# Patient Record
Sex: Female | Born: 1975
Health system: Southern US, Community
[De-identification: ages and names within clinical notes are randomized; demographics above are authoritative.]

## PROBLEM LIST (undated history)

## (undated) DIAGNOSIS — F419 Anxiety disorder, unspecified: Secondary | ICD-10-CM

## (undated) DIAGNOSIS — K8020 Calculus of gallbladder without cholecystitis without obstruction: Secondary | ICD-10-CM

## (undated) DIAGNOSIS — G44019 Episodic cluster headache, not intractable: Secondary | ICD-10-CM

## (undated) DIAGNOSIS — R42 Dizziness and giddiness: Secondary | ICD-10-CM

## (undated) DIAGNOSIS — N200 Calculus of kidney: Secondary | ICD-10-CM

## (undated) DIAGNOSIS — Z87442 Personal history of urinary calculi: Secondary | ICD-10-CM

## (undated) DIAGNOSIS — N939 Abnormal uterine and vaginal bleeding, unspecified: Secondary | ICD-10-CM

## (undated) DIAGNOSIS — G44009 Cluster headache syndrome, unspecified, not intractable: Secondary | ICD-10-CM

## (undated) DIAGNOSIS — D259 Leiomyoma of uterus, unspecified: Secondary | ICD-10-CM

## (undated) DIAGNOSIS — K589 Irritable bowel syndrome without diarrhea: Secondary | ICD-10-CM

## (undated) DIAGNOSIS — R51 Headache: Secondary | ICD-10-CM

## (undated) DIAGNOSIS — Z973 Presence of spectacles and contact lenses: Secondary | ICD-10-CM

## (undated) DIAGNOSIS — D649 Anemia, unspecified: Secondary | ICD-10-CM

## (undated) HISTORY — DX: Calculus of kidney: N20.0

## (undated) HISTORY — DX: Cluster headache syndrome, unspecified, not intractable: G44.009

## (undated) HISTORY — PX: EYE SURGERY: SHX253

## (undated) HISTORY — PX: CHOLECYSTECTOMY: SHX55

## (undated) HISTORY — PX: STRABISMUS SURGERY: SHX218

## (undated) HISTORY — DX: Anemia, unspecified: D64.9

## (undated) HISTORY — DX: Dizziness and giddiness: R42

---

## 1997-07-28 ENCOUNTER — Other Ambulatory Visit: Admission: RE | Admit: 1997-07-28 | Discharge: 1997-07-28 | Payer: Self-pay | Admitting: Obstetrics and Gynecology

## 1997-12-12 ENCOUNTER — Other Ambulatory Visit: Admission: RE | Admit: 1997-12-12 | Discharge: 1997-12-12 | Payer: Self-pay | Admitting: Obstetrics and Gynecology

## 2000-02-04 ENCOUNTER — Other Ambulatory Visit: Admission: RE | Admit: 2000-02-04 | Discharge: 2000-02-04 | Payer: Self-pay | Admitting: Obstetrics and Gynecology

## 2001-04-26 ENCOUNTER — Other Ambulatory Visit: Admission: RE | Admit: 2001-04-26 | Discharge: 2001-04-26 | Payer: Self-pay | Admitting: Obstetrics and Gynecology

## 2002-04-29 ENCOUNTER — Other Ambulatory Visit: Admission: RE | Admit: 2002-04-29 | Discharge: 2002-04-29 | Payer: Self-pay | Admitting: Obstetrics and Gynecology

## 2002-07-26 ENCOUNTER — Ambulatory Visit (HOSPITAL_COMMUNITY): Admission: RE | Admit: 2002-07-26 | Discharge: 2002-07-26 | Payer: Self-pay | Admitting: Obstetrics and Gynecology

## 2002-07-26 ENCOUNTER — Encounter: Payer: Self-pay | Admitting: Obstetrics and Gynecology

## 2002-12-02 ENCOUNTER — Inpatient Hospital Stay (HOSPITAL_COMMUNITY): Admission: AD | Admit: 2002-12-02 | Discharge: 2002-12-05 | Payer: Self-pay | Admitting: Obstetrics and Gynecology

## 2002-12-06 ENCOUNTER — Encounter: Admission: RE | Admit: 2002-12-06 | Discharge: 2003-01-05 | Payer: Self-pay | Admitting: Obstetrics and Gynecology

## 2002-12-06 ENCOUNTER — Observation Stay (HOSPITAL_COMMUNITY): Admission: AD | Admit: 2002-12-06 | Discharge: 2002-12-07 | Payer: Self-pay | Admitting: Obstetrics and Gynecology

## 2003-02-05 ENCOUNTER — Encounter: Admission: RE | Admit: 2003-02-05 | Discharge: 2003-03-07 | Payer: Self-pay | Admitting: Obstetrics and Gynecology

## 2003-03-08 ENCOUNTER — Encounter: Admission: RE | Admit: 2003-03-08 | Discharge: 2003-04-07 | Payer: Self-pay | Admitting: Obstetrics and Gynecology

## 2003-05-06 ENCOUNTER — Encounter: Admission: RE | Admit: 2003-05-06 | Discharge: 2003-06-05 | Payer: Self-pay | Admitting: Obstetrics and Gynecology

## 2003-07-06 ENCOUNTER — Encounter: Admission: RE | Admit: 2003-07-06 | Discharge: 2003-08-05 | Payer: Self-pay | Admitting: Obstetrics and Gynecology

## 2003-09-05 ENCOUNTER — Encounter: Admission: RE | Admit: 2003-09-05 | Discharge: 2003-10-05 | Payer: Self-pay | Admitting: Obstetrics and Gynecology

## 2003-10-06 ENCOUNTER — Encounter: Admission: RE | Admit: 2003-10-06 | Discharge: 2003-11-05 | Payer: Self-pay | Admitting: Obstetrics and Gynecology

## 2003-11-30 ENCOUNTER — Other Ambulatory Visit: Admission: RE | Admit: 2003-11-30 | Discharge: 2003-11-30 | Payer: Self-pay | Admitting: Obstetrics and Gynecology

## 2003-12-06 ENCOUNTER — Encounter: Admission: RE | Admit: 2003-12-06 | Discharge: 2004-01-05 | Payer: Self-pay | Admitting: Obstetrics and Gynecology

## 2004-02-21 ENCOUNTER — Ambulatory Visit: Payer: Self-pay | Admitting: Internal Medicine

## 2004-05-09 ENCOUNTER — Ambulatory Visit: Payer: Self-pay | Admitting: Internal Medicine

## 2004-06-18 ENCOUNTER — Ambulatory Visit: Payer: Self-pay | Admitting: Internal Medicine

## 2004-07-03 ENCOUNTER — Ambulatory Visit: Payer: Self-pay | Admitting: Internal Medicine

## 2004-07-03 ENCOUNTER — Encounter (INDEPENDENT_AMBULATORY_CARE_PROVIDER_SITE_OTHER): Payer: Self-pay | Admitting: *Deleted

## 2004-07-03 HISTORY — PX: COLONOSCOPY WITH PROPOFOL: SHX5780

## 2004-07-17 ENCOUNTER — Ambulatory Visit: Payer: Self-pay | Admitting: Internal Medicine

## 2004-10-09 ENCOUNTER — Ambulatory Visit: Payer: Self-pay | Admitting: Internal Medicine

## 2004-11-13 ENCOUNTER — Ambulatory Visit: Payer: Self-pay | Admitting: Internal Medicine

## 2005-02-06 ENCOUNTER — Ambulatory Visit: Payer: Self-pay | Admitting: Internal Medicine

## 2005-07-02 ENCOUNTER — Other Ambulatory Visit: Admission: RE | Admit: 2005-07-02 | Discharge: 2005-07-02 | Payer: Self-pay | Admitting: Obstetrics and Gynecology

## 2007-12-24 ENCOUNTER — Telehealth: Payer: Self-pay | Admitting: Internal Medicine

## 2008-12-14 ENCOUNTER — Ambulatory Visit (HOSPITAL_COMMUNITY): Admission: RE | Admit: 2008-12-14 | Discharge: 2008-12-14 | Payer: Self-pay | Admitting: Obstetrics and Gynecology

## 2009-04-16 ENCOUNTER — Inpatient Hospital Stay (HOSPITAL_COMMUNITY): Admission: AD | Admit: 2009-04-16 | Discharge: 2009-04-17 | Payer: Self-pay | Admitting: Obstetrics and Gynecology

## 2009-05-05 ENCOUNTER — Inpatient Hospital Stay (HOSPITAL_COMMUNITY): Admission: AD | Admit: 2009-05-05 | Discharge: 2009-05-05 | Payer: Self-pay | Admitting: Obstetrics and Gynecology

## 2009-05-07 ENCOUNTER — Inpatient Hospital Stay (HOSPITAL_COMMUNITY): Admission: AD | Admit: 2009-05-07 | Discharge: 2009-05-10 | Payer: Self-pay | Admitting: Obstetrics and Gynecology

## 2010-02-17 ENCOUNTER — Encounter: Payer: Self-pay | Admitting: Family Medicine

## 2010-04-13 ENCOUNTER — Emergency Department (HOSPITAL_COMMUNITY)
Admission: EM | Admit: 2010-04-13 | Discharge: 2010-04-13 | Disposition: A | Discharge status: Home / Self Care | Payer: Self-pay | Attending: Emergency Medicine | Admitting: Emergency Medicine

## 2010-04-13 ENCOUNTER — Emergency Department (HOSPITAL_COMMUNITY): Payer: Self-pay

## 2010-04-13 DIAGNOSIS — R109 Unspecified abdominal pain: Secondary | ICD-10-CM | POA: Insufficient documentation

## 2010-04-13 DIAGNOSIS — N949 Unspecified condition associated with female genital organs and menstrual cycle: Secondary | ICD-10-CM | POA: Insufficient documentation

## 2010-04-13 DIAGNOSIS — N898 Other specified noninflammatory disorders of vagina: Secondary | ICD-10-CM | POA: Insufficient documentation

## 2010-04-13 DIAGNOSIS — R102 Pelvic and perineal pain: Secondary | ICD-10-CM

## 2010-04-13 LAB — WET PREP, GENITAL: Clue Cells Wet Prep HPF POC: NONE SEEN

## 2010-04-13 LAB — URINALYSIS, ROUTINE W REFLEX MICROSCOPIC
Bilirubin Urine: NEGATIVE
Nitrite: NEGATIVE
Specific Gravity, Urine: 1.024 (ref 1.005–1.030)
Urobilinogen, UA: 0.2 mg/dL (ref 0.0–1.0)
pH: 6 (ref 5.0–8.0)

## 2010-04-13 LAB — URINE MICROSCOPIC-ADD ON

## 2010-04-17 LAB — CBC
HCT: 26.6 % — ABNORMAL LOW (ref 36.0–46.0)
HCT: 32.4 % — ABNORMAL LOW (ref 36.0–46.0)
Hemoglobin: 8.8 g/dL — ABNORMAL LOW (ref 12.0–15.0)
Hemoglobin: 9 g/dL — ABNORMAL LOW (ref 12.0–15.0)
MCHC: 33.2 g/dL (ref 30.0–36.0)
MCV: 87.8 fL (ref 78.0–100.0)
Platelets: 283 10*3/uL (ref 150–400)
RBC: 3.02 MIL/uL — ABNORMAL LOW (ref 3.87–5.11)
RBC: 3.02 MIL/uL — ABNORMAL LOW (ref 3.87–5.11)
RBC: 3.76 MIL/uL — ABNORMAL LOW (ref 3.87–5.11)
WBC: 13.1 10*3/uL — ABNORMAL HIGH (ref 4.0–10.5)

## 2010-04-17 LAB — COMPREHENSIVE METABOLIC PANEL
BUN: 4 mg/dL — ABNORMAL LOW (ref 6–23)
CO2: 27 mEq/L (ref 19–32)
Calcium: 8.7 mg/dL (ref 8.4–10.5)
Creatinine, Ser: 0.38 mg/dL — ABNORMAL LOW (ref 0.4–1.2)
GFR calc non Af Amer: >60 mL/min (ref >60)
Glucose, Bld: 79 mg/dL (ref 70–99)

## 2010-04-17 LAB — GC/CHLAMYDIA PROBE AMP, GENITAL: Chlamydia, DNA Probe: NEGATIVE

## 2010-04-17 LAB — RPR: RPR Ser Ql: NONREACTIVE

## 2010-04-21 LAB — URINE MICROSCOPIC-ADD ON

## 2010-04-21 LAB — URINALYSIS, ROUTINE W REFLEX MICROSCOPIC
Ketones, ur: NEGATIVE mg/dL
Nitrite: NEGATIVE
Specific Gravity, Urine: 1.01 (ref 1.005–1.030)
pH: 7 (ref 5.0–8.0)

## 2010-04-21 LAB — URINE CULTURE: Culture: NO GROWTH

## 2010-06-14 NOTE — Consult Note (Signed)
NAME:  Janet Myers, Janet Myers                       ACCOUNT NO.:  [id number]   MEDICAL RECORD NO.:  [id number]                   PATIENT TYPE:  INP   LOCATION:  9374                                 FACILITY:  [facility]   PHYSICIAN:  Michael L. Thad Ranger, M.D.           DATE OF BIRTH:  Apr 23, 1975   DATE OF CONSULTATION:  12/07/2002  DATE OF DISCHARGE:                                   CONSULTATION   CONSULTING PHYSICIAN:  Casimiro Needle L. Thad Ranger, M.D.   REQUESTING PHYSICIAN:  Janine Limbo, M.D.   REASON FOR EVALUATION:  Paresthesias and abnormal muscle activity.   HISTORY OF PRESENT ILLNESS:  This is the initial inpatient consultation  evaluation of this 35 year old woman with little past medical history who is  four days postpartum from a spontaneous vaginal delivery with prolonged  second stage but otherwise uneventful.  She was monitored for pregnancy  induced hypertension during the pregnancy but otherwise the pregnancy was  uneventful.  The patient reports that the night after delivering, she had a  sensation of which a numb and tingling feeling came across her body starting  at her lips and moving down involving all of her arms and legs.  This went  on for a few hours and was uncomfortable enough to keep her from sleeping.  Since that time, the symptom has been present intermittently.  It became  fairly severe on the day prior to admission when in association with this  she noted that her wrists and fingers seemed to flex involuntarily.  Since  that time, she continues to have some involuntary movements of the muscles,  although these are not as severe.  It has been noted in the hospital, that  inflation of a blood pressure cuff tends to bring this out in the right arm.  She does note that this sometimes will happen more on one side or the other.  However, she is not having any definite focal motor activity, any  involuntary twitching, clonic activity and there is no alteration of  consciousness with any of these symptoms.  She also denies any focal  weakness.  There has been no other focal neurologic change.  A neurologic  consultation is requested for further consideration of these symptoms.  The  patient does also note that yesterday when her hands were involuntarily  clenching that she had some twitching movements of her eyes.  Upon  presentation to [facility], she was found to be mildly hypokalemic and  this has been replaced.   PAST MEDICAL HISTORY:  As above.  Beyond that she denies any other medical  problems.   Family history, social history, review of systems:  Per HPI and admission  H&P.   MEDICATIONS:  She has been receiving intravenous and oral potassium as well  as Labetalol p.r.n.   ALLERGIES:  No known drug allergies.   PHYSICAL EXAMINATION:  VITAL SIGNS:  Temperature 98.4, blood pressure  150/82,  pulse 85, respirations 20, O2 sat 94% on room air.  GENERAL:  This is a fairly anxious but otherwise healthy-appearing woman in  no evident distress.  HEENT:  Head, cranium is normocephalic and atraumatic.  Oropharynx is  benign.  NECK:  Supple.  NEUROLOGIC:  Mental status:  She is awake, alert and oriented.  Speech is  fluent and not dysarthric.  Mood is euthymic.  Affect is appropriate.  She  has no difficulty with naming and can follow complex commands.  Cranial  nerves, funduscopic exam was benign.  Pupils equal and briskly reactive.  Extraocular movements noted without nystagmus.  Visual fields were full on  confrontation.  Face, tongue and palate move normally and symmetrically.  No  fasciculations or other adventitious movements of the face, tongue or palate  are noted.  Neck:  Flexion and shoulder shrug strength are normal.  Motor:  Normal bulk and tone.  Normal strength in all test extremity muscles.  I did  observe when the blood pressure cuff was inflated a slight tendency towards  some clawing of the hands which was associated with  some muscle stiffness  but this was very mild.  There were no fasciculations or other adventitious  movements of the muscles and no myatonia was noted.  Sensation intact to  light touch and pin prick in all extremities.  Coordination:  Rapid  movements are performed well.  Finger-to-nose is performed well with a  little bit of a physiologic tremor coming out.  Gait, she arises from the  bed easily.  Her stance is normal.  She is able to toe walk and performs a  Romberg maneuver without difficulty.  Reflexes slightly brisk throughout but  not pathologic.  Toes are downgoing.   LABORATORY REVIEW:  On admission, potassium was low at 3.0.  Calcium was low  at 7.7 which is a bit low even corrected for a slightly low albumin of 3.2.  Chemistries are otherwise unremarkable.  CBC white count 13.0 down from 21.2  at hospital discharge three days ago, hemoglobin 9.5 platelets 447,000.  White count was 12 today with a hemoglobin of 8.8.  Her potassium level this  morning is 3.2.   She has not had a magnesium level done.  She has had no neuroimaging.   IMPRESSION:  Paresthesias and tetany related to multiple electrolyte  derangements.  She does have some mild hypocalcemia and hypokalemia and I  suspect she may also have some hypomagnesemia, although this has not been  measured.  I think this is the origin of her symptoms and they are coming  out a little bit more prominently due to some anxiety with some associated  hyperventilation and alkalosis.   PLAN:  I reassured her that she did not have any serious underlying  neurologic disease and advised her that reduce her anxiety about the  symptoms probably would help them.  I also reassured her that these should  go away with time.  She should definitely eat foods rich in potassium,  calcium and magnesium in an effort to replace these electrolytes over the  next several days.  I do not think she needs any further workup at this time.  Thank you for  the consultation.  Michael L. Thad Ranger, M.D.    MLR/MEDQ  D:  12/07/2002  T:  12/07/2002  Job:  [id number]

## 2010-06-14 NOTE — H&P (Signed)
NAME:  Janet Myers, Janet Myers                       ACCOUNT NO.:  [id number]   MEDICAL RECORD NO.:  [id number]                   PATIENT TYPE:  INP   LOCATION:  9163                                 [facility]ACILITY:  [facility]   PHYSICIAN:  Janine Limbo, M.D.            DATE OF BIRTH:  1975/05/08   DATE OF ADMISSION:  12/02/2002  DATE OF DISCHARGE:                                HISTORY & PHYSICAL   HISTORY OF PRESENT ILLNESS:  Janet Myers is a 35 year old married white  female, gravida 1, para 0, at 84 weeks' gestation who presents complaining  of uterine contractions every five minutes since about 5 p.m. this  afternoon.  She also reports seeing some bloody show.  She denies any  leaking.  She denies any nausea, vomiting, headaches, or visual  disturbances.  She was originally scheduled for induction of labor tomorrow  morning.  Her pregnancy has been followed at [facility] by the  certified nurse midwife service and has been complicated by:  1. Gallbladder polyps giving her spasms early in her pregnancy that is now     resolved.  2. Breech presentation around 36 weeks' but spontaneously averted.  3. First trimester spotting.   Her group B strep is negative.  She plans an epidural for her labor.   OB/GYN HISTORY:  She is a primigravida with an LMP of February 17, 2002,  giving her an [facility] of November 24, 2002, confirmed by ultrasound.  Her other  gyn history is noncontributory.   GENERAL MEDICAL HISTORY:  History of occasional urinary tract infection,  kidney infection one time, occasional migraine headache, and her only  surgery was eye surgery at age 70 or 67, and wisdom teeth in 35.   FAMILY HISTORY:  Significant for paternal great grandmother with MI and  heart disease.  Father and aunts with hypertension.  Maternal grandmother  and paternal grandmother and mother with varicose veins.  Father with  diabetes and renal disease.   GENETIC HISTORY:  Negative.   SOCIAL  HISTORY:  She is married to [employer] who is involved and  supportive.  They are both employed full time.  They deny any illicit drug  use, alcohol, or smoking with this pregnancy.  They deny any religious  affiliation that effects their care.   PRENATAL LABS:  Her blood type is A-positive, her antibody screen in  negative,  syphilis is nonreactive, rubella immune, hepatitis B surface  antigen is negative, HIV is nonreactive, GC and Chlamydia are both negative.  Pap was within normal limits.  Varicella was positive and her 36 week beta  strep was negative.   PHYSICAL EXAMINATION:  VITAL SIGNS:  Systolics are 140-160, diastolics are  91-104.  She is afebrile.  HEENT:  Grossly within normal limits.  HEART:  Regular rhythm and rate.  CHEST:  Clear.  BREASTS:  Soft, nontender.  ABDOMEN:  Gravid with uterine contractions every 3-5  minutes.  Her fetal  heart rate is reactive and reassuring.  Her cervix is 3, 90%, vertex -1 with  bulging membranes.  EXTREMITIES:  Within normal limits.   ASSESSMENT:  1. Intrauterine pregnancy 41 weeks' gestation.  2. Early labor.  3. Negative group B strep.  4. Rule out preeclampsia.   PLAN:  Admit to labor and delivery and to collect [facility] labs and clean catch  UA and to follow routine CNM orders unless [facility] labs are abnormal at which  point she will transfer to the M.D. service and to notify Dr. Stefano Gaul of  patient's admission.     Janet Myers. [place]plantis, C.N.M.              Janine Limbo, M.D.    SJD/MEDQ  D:  12/02/2002  T:  12/02/2002  Job:  [id number]

## 2010-06-14 NOTE — Op Note (Signed)
   NAME:  Janet Myers, Janet Myers                       ACCOUNT NO.:  [id number]   MEDICAL RECORD NO.:  [id number]                   PATIENT TYPE:  INP   LOCATION:  9119                                 [facility]ACILITY:  [facility]   PHYSICIAN:  Naima A. Dillard, M.D.              DATE OF BIRTH:  1975/11/28   DATE OF PROCEDURE:  DATE OF DISCHARGE:                                 OPERATIVE REPORT   PROCEDURE:  Vacuum-assisted vaginal delivery.   I was called to see the patient secondary to maternal exhaustion and  prolonged second stage.  The patient was +2 station, left occiput anterior.  The patient was consented for cesarean section, told the risks are bleeding,  infection, damage to internal organs such as bowel and bladder, and also  consented for vacuum-assisted vaginal delivery, told the risks to the fetus  were scalp abrasions, cephalohematoma, subgaleal hemorrhage, and  intraventricular hemorrhage, damage to maternal tissues, vagina and cervix.  The patient and husband consented for vacuum-assisted vaginal delivery.  The  vacuum was placed, and its position was checked.  There were three pulls and  three popoffs.  Suction was released in between each pull. The head did  deliver to +3 station, and each pull was in the green zone at 500 mmHg.  The  patient was then consented for cesarean section but stated that she desired  to keep pushing because the fetal heart tones were adequate.  The patient  did push, and after four hours of pushing, the head delivered to perineum,  and the body delivered without difficulty, was handed to the nurse in  attendance.  Cord was clamped and cut.  Mouth and nares were bulb suctioned.  Placenta delivered intact with three-vessel cord at 75.  There was a  midline episiotomy with second-degree extension which was repaired in a  normal fashion with 2-0 chromic.  Estimated blood loss was 400 cc.  Infant  was born at 35 with Apgars of 6 and 8.  A cord pH was done, but  they said  there was not enough blood to be taken for cord pH.                                               Naima A. Normand Sloop, M.D.    NAD/MEDQ  D:  12/03/2002  T:  12/03/2002  Job:  [id number]

## 2010-06-14 NOTE — H&P (Signed)
NAME:  Janet Myers, Janet Myers                       ACCOUNT NO.:  [id number]   MEDICAL RECORD NO.:  [id number]                   PATIENT TYPE:  INP   LOCATION:  9374                                 [facility]   PHYSICIAN:  Osborn Coho, M.D.                DATE OF BIRTH:  22-Aug-1975   DATE OF ADMISSION:  12/06/2002  DATE OF DISCHARGE:                                HISTORY & PHYSICAL   HISTORY OF PRESENT ILLNESS:  The patient is a 35 year old gravida 1, para 0,  three days status post vaginal delivery who presented with onset of tingling  in hands and face, twitching of eyes, drawing up of hands into a claw-like  position.  She was seen at [facility] with blood pressure of  132/86.  She was sent to maternity admission unit for further evaluation.  The patient is breastfeeding.  She denies loss of consciousness, nausea and  vomiting, dysuria, back pain, heavy bleeding, any new medicines, fever, or  slurred speech.  Her history has been remarkable for:  1) Four-hour second  stage with failure of vacuum extraction, but eventual vaginal delivery.  2)  Mild elevated blood pressure in the third trimester with patient induced due  to that, but negative PIH evaluation.  3) History of gallbladder polyps.  4)  Breech presentation until 37 to 38 weeks with spontaneous version.  5)  History of rare migraines with none in the last one to two years.  6)  Prolonged second stage.   LABORATORY DATA:  Catheterization UA was negative for protein.  Specific  gravity was 1.025.  CBC showed hemoglobin of 9.5, hematocrit 27.4, white  blood cell count of 13, and platelet count of 447.  In the hospital, her  hemoglobin was 12.9 and platelets were 265 during her labor.  Her  comprehensive metabolic panel was remarkable for potassium of 3.0, calcium  7.7 both of which were decreased.  SGOT, SGPT, LDH, and uric acid were  within normal limits.  Blood type is A positive, Rh antibody negative, VDRL  nonreactive, rubella titer positive, hepatitis B surface antigen negative,  human immunodeficiency virus nonreactive.  Varicella was immune.  Pap was  normal.  GC and Chlamydia cultures were negative in March of 2004.  One-hour  Glucola in August was normal.  Group B Strep, gonorrhea, and Chlamydia in  October were all negative.   HISTORY OF PRESENT ILLNESS:  As previously described with these symptoms of  tingling of hands and feet, twitching of eyes, drawing up of hands into a  claw-like position were noted today. She reported one episode of this on  Saturday night after delivery, but it resolved with onset of sleep.   PAST OBSTETRICAL HISTORY:  The patient is a gravida 1, para 1, with a  history of a spontaneous vaginal delivery on December 03, 2002.  She was  induced for mild elevations of her blood  pressures, but blood pressure and  PIH workup were negative during her labor.  She did progress to fully  dilated, but then had approximately two hours of pushing with progress to a  +2 station.  Vacuum extraction was offered and accepted, however, this did  not work since she had some popoffs.  The patient then desired to push  additionally even though cesarean section was offered.  She pushed for  another approximately two hours and had a spontaneous vaginal delivery over  a midline episiotomy with a second degree laceration.  She had a viable female  infant by the name of Janet Myers that weighed 8 pounds 10 ounces, Apgars were 6  and 8.  Her hemoglobin on day #1 after delivery was 9.1.   PAST MEDICAL HISTORY:  She has had occasional yeast infections.  She reports  frequent urinary tract infections.  She has had a history of migraines, but  none in the past one to two years.   ALLERGIES:  No known drug allergies.   PAST SURGICAL HISTORY:  Remarkable for eye surgery as a child.  She does  have a lazy lid on the left-hand side.  Wisdom teeth extraction in 1995.   FAMILY HISTORY:  Father and  aunt have hypertension.  Multiple family members  have varicose veins.  Father has an unknown type of thyroid problem. Father  has a history of renal disease.  Maternal grandmother and two aunts have  breast cancer.  Depression on the father's side of the family.  Genetic  history is remarkable for the patient's aunt with cerebral palsy.   SOCIAL HISTORY:  The patient is married to the father of the baby. His name  is Janet Myers.  He is involved and supportive.  He is high school educated and is  employed full-time.  The patient has one year of college and is employed  full-time.  They deny any alcohol, drug, or tobacco use during this  pregnancy.   REVIEW OF SYSTEMS:  The patient reports previously noted hypertension,  twitching of the hands, drawing of the hands, but denies any nausea,  vomiting, diarrhea, dysuria, back pain, heavy bleeding, new medicines,  fever, or slurred speech.   PHYSICAL EXAMINATION:  VITAL SIGNS:  Blood pressure 143/112, 144/99, 149/95,  150/125, 148/102.  Other vital signs are stable.  HEENT:  Remarkable for a lazy lid noted on the left-hand side, although, the  patient reports this is a preexisting finding.  Pupils equal, round, and  reactive to light.  HEART:  Regular rate and rhythm without murmur.  BREASTS:  Full and engorged.  ABDOMEN:  Soft and nontender.  Well-involuted with the fundus at  approximately 14 weeks size and nontender.  PELVIC:  Lochia scant.  Perineum shows a healing midline episiotomy with a  second degree laceration.  EXTREMITIES:  Deep tendon reflexes are 2+ without clonus.  There is a trace  edema in the lower extremities.  There is sporadic evidence of the drawing  of the hands, often associated with tightening of the blood pressure cuff,  but also occurs spontaneously bilaterally.   IMPRESSION:  1. Three days status post vaginal delivery with a prolonged second stage.  2. Gestational hypertension.  3. Dyskinesia of hands. 4.  Hypokalemia.   PLAN:  1. Admitted to [facility] for blood pressure management,     potassium replacement, and observation.  2. Potassium replacement with three initial runs of KCL at 10 mEq, then 20  mEq KCL p.o. in the morning.  3. Labetalol 10 mg IV now with a repeat on a p.r.n. basis per Dr. Su Hilt     order.  4. Close observation of blood pressure and status.  5. Repeat labs in the morning.  6. M.D.'s will follow.     Renaldo Reel Emilee Hero, C.N.M.                   Osborn Coho, M.D.    VLL/MEDQ  D:  12/06/2002  T:  12/06/2002  Job:  [id number]

## 2010-06-14 NOTE — H&P (Signed)
NAME:  Janet, Myers                       ACCOUNT NO.:  [id number]   MEDICAL RECORD NO.:  [id number]                   PATIENT TYPE:   LOCATION:                                       FACILITY:   PHYSICIAN:  Naima A. Dillard, M.D.              DATE OF BIRTH:  Nov 08, 1975   DATE OF ADMISSION:  DATE OF DISCHARGE:                                HISTORY & PHYSICAL   Janet Myers is a 35 year old gravida 1, para 0, who will be 41-2/7 weeks on  December 03, 2002, when she is scheduled to present to the hospital for  induction of labor.  Her pregnancy has been followed by the [facility] Certified Nurse Midwife Service and has been remarkable for:  1.  First trimester spotting.  2.  History of rare migraines.  3.  Low body mass  index.  4.  Gallbladder polyps.  5.  Labile blood pressure.  6.  Previous  breech fetus until 39-weeks when the fetus spontaneously turned to vertex.  7.  Group B strep negative.  At her visit in the office on November 30, 2002,  her blood pressure was slightly elevated at 140/82 and 164/90, but her PIH  blood work was all within normal limits.   PRENATAL LABS:  Her prenatal labs were collected on April 29, 2002.  Hemoglobin was 12.3, hematocrit 35.9, platelets 340,000.  Blood type A  positive.  Antibody negative.  RPR nonreactive.  Rubella immune.  Hepatitis  B surface antigen negative.  HIV nonreactive.  Varicella immune.  Pap smear  within normal limits.  Gonorrhea and Chlamydia were negative from March  2004.  Her one hour Glucola, on August 30, 2002, was 110 and culture of the  vaginal tract for Group B strep, Gonorrhea and Chlamydia, from November 01, 2002, were all negative.   HISTORY OF PRESENT PREGNANCY:  She presented for care at [facility],  on April 29, 2002, at 10-weeks gestation.  She was seen, on Jun 15, 2002,  with some flank pain and a negative urine culture.  She had an evaluation by  Dr. Brunilda Payor of her chronic hematuria as well as  her flank pain and there was no  evidence of kidney stones.  Pregnancy ultrasonography at 18-weeks gestation  shows growth consistent with previous dating and the anatomy was seen in  full on July 22, 2002.  At approximately 23-weeks gestation, she began  having some right upper quadrant and back pain for which she received a  gallbladder ultrasound with no evidence of stones but there were gallbladder  polyps.  The pain subsided spontaneously.  At her visit at 35-weeks  gestation, it was discovered that the fetus was in the frank breech  presentation.  The patient declined external cephalic version and met with  Dr. Su Hilt to discuss a planned cesarean section, if the position remained  breech.  By 38-weeks the fetus turned  spontaneously to vertex and then the  pregnancy care continued to be unremarkable, until the most recent visit  with a slightly elevated blood pressure but normal PIH lab work.   OBSTETRICAL HISTORY:  She is a gravida 1, para 0.   MEDICAL HISTORY:  1. She has no medication allergies.  2. She reports having had an occasional yeast infection.  3. Frequent urinary tract infections.  4. A history of migraines but none for the past 1-2 years.   PAST SURGICAL HISTORY:  1. Remarkable for eye surgery as a child.  2. Wisdom teeth extraction in 1995.   FAMILY MEDICAL HISTORY:  Remarkable for father and an aunt with a history of  hypertension.  Multiple family members with varicose veins.  A father with  unknown type of thyroid dysfunction.  Father with history of renal disease.  Paternal grandmother and two aunts with breast cancer.  Depression on the  father's side of the family.   GENETIC HISTORY:  Remarkable for patient's aunt with cerebral palsy.   SOCIAL HISTORY:  The patient is married to the father of the baby.  His name  is Janet Myers.  He is high school educated and employed full-time.  The patient  has one year of college education and is employed full-time.  They  deny any  alcohol, tobacco or illicit drug use with the pregnancy.   OBJECTIVE DATA:  VITAL SIGNS:  Blood pressure was 140/82, other vital signs  were stable.  She is afebrile.  HEENT:  Grossly within normal limits.  CHEST:  Clear to auscultation.  HEART:  Regular rate and rhythm.  ABDOMEN:  Gravid in contour with fundal height extending approximately 38-cm  above the pubic symphysis.  Electronic fetal monitoring is remarkable for  reactive fetal heart rate with rare uterine contractions.  PELVIC:  Cervical exam 3- to -4-cm, 75% effaced, vertex, -2.  EXTREMITIES:  Reflexes are normal with trace edema.   ASSESSMENT:  1. Intrauterine pregnancy at term.  2. Labile blood pressure.  3. Favorable cervix.   PLAN:  Admit for induction of labor via the AROM method and risks and  benefits have been discussed with the patient, and she agrees to proceed.     Cam Hai, C.N.M.                     Naima A. Normand Sloop, M.D.    KS/MEDQ  D:  12/02/2002  T:  12/02/2002  Job:  [id number]

## 2011-01-07 ENCOUNTER — Encounter: Payer: Self-pay | Admitting: Internal Medicine

## 2011-01-07 ENCOUNTER — Ambulatory Visit (INDEPENDENT_AMBULATORY_CARE_PROVIDER_SITE_OTHER): Payer: BC Managed Care – PPO | Admitting: Internal Medicine

## 2011-01-07 VITALS — BP 110/80 | Temp 98.0°F | Ht 61.0 in | Wt 106.0 lb

## 2011-01-07 DIAGNOSIS — G5600 Carpal tunnel syndrome, unspecified upper limb: Secondary | ICD-10-CM

## 2011-01-07 NOTE — Patient Instructions (Signed)
Consider using wrist splints at night if symptoms worse consider  Consider ibuprofen 2 tablets every 6 hours as needed  Call or return to clinic prn if these symptoms worsen or fail to improve as anticipated.

## 2011-01-07 NOTE — Progress Notes (Signed)
  Subjective:    Patient ID: Janet Myers, female    DOB: 11-11-1975, 35 y.o.   MRN: [id number]  HPI  34 year old patient who was evaluated recently at the urgent care and is being treated for acute bronchitis. She feels that she is slightly improved today. She has a long history of numbness involving both hands the left greater than the right. This is her chief concern today. She has had 2 pregnancies but apparently this was not worsened by pregnancy. 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   HENT: Positive for congestion and rhinorrhea. Negative for hearing loss, sore throat, dental problem, sinus pressure and tinnitus.   Eyes: Negative for pain, discharge and visual disturbance.  Respiratory: Positive for cough. Negative for shortness of breath.   Cardiovascular: Negative for chest pain, palpitations and leg swelling.  Gastrointestinal: Negative for nausea, vomiting, abdominal pain, diarrhea, constipation, blood in stool and abdominal distention.  Genitourinary: Negative for dysuria, urgency, frequency, hematuria, flank pain, vaginal bleeding, vaginal discharge, difficulty urinating, vaginal pain and pelvic pain.  Musculoskeletal: Negative for joint swelling, arthralgias and gait problem.  Skin: Negative for rash.  Neurological: Negative for dizziness, syncope, speech difficulty, weakness, numbness and headaches.  Hematological: Negative for adenopathy.  Psychiatric/Behavioral: Negative for behavioral problems, dysphoric mood and agitation. The patient is not nervous/anxious.        Objective:   Physical Exam  Constitutional: She is oriented to person, place, and time. She appears well-developed and well-nourished.  HENT:  Head: Normocephalic.  Right Ear: External ear normal.  Left Ear: External ear normal.  Mouth/Throat: Oropharynx is clear and moist.  Eyes: Conjunctivae and EOM are normal.  Pupils are equal, round, and reactive to light.  Neck: Normal range of motion. Neck supple. No thyromegaly present.  Cardiovascular: Normal rate, regular rhythm, normal heart sounds and intact distal pulses.   Pulmonary/Chest: Effort normal and breath sounds normal.  Abdominal: Soft. Bowel sounds are normal. She exhibits no mass. There is no tenderness.  Musculoskeletal: Normal range of motion.       Positive Tinel's  Lymphadenopathy:    She has no cervical adenopathy.  Neurological: She is alert and oriented to person, place, and time.  Skin: Skin is warm and dry. No rash noted.  Psychiatric: She has a normal mood and affect. Her behavior is normal.          Assessment & Plan:   Bilateral carpal tunnel syndrome left greater than the right Resolving acute bronchitis  Discussed at length symptoms seem fairly minor. Will consider ibuprofen or possible splinting at night if symptoms worsen. Will attempt to avoid overuse

## 2011-02-18 ENCOUNTER — Encounter: Payer: Self-pay | Admitting: Family

## 2011-02-18 ENCOUNTER — Ambulatory Visit (INDEPENDENT_AMBULATORY_CARE_PROVIDER_SITE_OTHER): Payer: BC Managed Care – PPO | Admitting: Family

## 2011-02-18 VITALS — BP 110/80 | Temp 99.4°F | Wt 108.0 lb

## 2011-02-18 DIAGNOSIS — D509 Iron deficiency anemia, unspecified: Secondary | ICD-10-CM

## 2011-02-18 DIAGNOSIS — N926 Irregular menstruation, unspecified: Secondary | ICD-10-CM

## 2011-02-18 DIAGNOSIS — H6980 Other specified disorders of Eustachian tube, unspecified ear: Secondary | ICD-10-CM

## 2011-02-18 DIAGNOSIS — I959 Hypotension, unspecified: Secondary | ICD-10-CM

## 2011-02-18 LAB — POCT URINALYSIS DIPSTICK
Glucose, UA: NEGATIVE
Nitrite, UA: NEGATIVE
Spec Grav, UA: >=1.03
Urobilinogen, UA: 1

## 2011-02-18 LAB — POCT URINE PREGNANCY: Preg Test, Ur: NEGATIVE

## 2011-02-18 NOTE — Patient Instructions (Signed)

## 2011-02-18 NOTE — Progress Notes (Signed)
Subjective:    Patient ID: Janet Myers, female    DOB: 23-Dec-1975, 37 y.o.   MRN: [id number]  HPI 36 year old white female, nonsmoker, who presents today with complaints of dizziness has been going on for one week.  She has had mild urinary frequency, and very mild lower abdominal pain and back pain. She tends to run a blood pressure of 110 systolically. Outside of the office, she is to run a blood pressure between 90 and 110. She is prescribed verapamil 120 mg 3 times a day for migraine headaches. However, she has not been taking it. Her last menstrual period was 02/12/2011. She denies any concerns or any sexually transmitted diseases. She is married in a monogamous relationship.She denies any sneezing, coughing, or congestion, sinus headache, chest pain, heart palpitations, or edema.   Review of Systems  Constitutional: Negative.   HENT: Negative.   Eyes: Negative.   Respiratory: Negative.   Gastrointestinal:       Very mild pelvic pain, but has a fibroid.  Genitourinary: Positive for frequency.  Musculoskeletal: Positive for back pain.       Very mild back pain  Skin: Negative.   Neurological: Negative.   Hematological: Negative.   Psychiatric/Behavioral: Negative.    No past medical history on file.  History   Social History  . Marital Status: Married    Spouse Name: N/A    Number of Children: N/A  . Years of Education: N/A   Occupational History  . Not on file.   Social History Main Topics  . Smoking status: Never Smoker   . Smokeless tobacco: Never Used  . Alcohol Use: Yes  . Drug Use: No  . Sexually Active: Not on file   Other Topics Concern  . Not on file   Social History Narrative  . No narrative on file    Past Surgical History  Procedure Date  . Eye surgery   . Cesarean section     Family History  Problem Relation Age of Onset  . Depression Mother   . Hepatitis Mother     c  . Osteopenia Mother   . Alcohol abuse Father   . COPD Father     . Heart disease Father   . Cirrhosis Father   . Hepatitis Father     c  . Osteopenia Father     Allergies  Allergen Reactions  . Codeine Nausea Only    Current Outpatient Prescriptions on File Prior to Visit  Medication Sig Dispense Refill  . JUNEL FE 1/20 1-20 MG-MCG tablet Take 1 tablet by mouth daily.       Marland Kitchen amoxicillin (AMOXIL) 875 MG tablet       . benzonatate (TESSALON) 100 MG capsule       . verapamil (CALAN) 120 MG tablet Take 120 mg by mouth 3 (three) times daily.         BP 110/80  Temp(Src) 99.4 F (37.4 C) (Oral)  Wt 108 lb (48.988 kg)chart    Objective:   Physical Exam  Constitutional: She is oriented to person, place, and time. She appears well-developed and well-nourished.  HENT:  Nose: Nose normal.  Mouth/Throat: Oropharynx is clear and moist.       Both ears have a moderate amount of fluid bilaterally.  Neck: Normal range of motion. Neck supple.  Cardiovascular: Normal rate, regular rhythm and normal heart sounds.   Pulmonary/Chest: Effort normal and breath sounds normal.  Abdominal: Bowel sounds are normal. There is no  rebound and no guarding.       Mild left lower quadrant tenderness to palpation.  Musculoskeletal: Normal range of motion.  Neurological: She is alert and oriented to person, place, and time.  Skin: Skin is warm and dry.  Psychiatric: She has a normal mood and affect. Her behavior is normal.     Urine pregnancy: Negative  Urinalysis: Within normal limits, patient on admission cycle currently.     Assessment & Plan:  Assessment: Eustachian tube dysfunction, dizziness  Plan: Encouraged an over-the-counter decongestant medication like Sudafed or Zyrtec-D to clear the fluid from her ears and help with dizziness. Labs and to include TSH, CMP, CBC, serum iron. She has a history of iron deficiency and hypokalemia. Will follow the patient and the results of her labs, and when necessary sooner. If her symptoms worsen, she will call the  office or report to the emergency department.

## 2011-02-20 ENCOUNTER — Telehealth: Payer: Self-pay | Admitting: Internal Medicine

## 2011-02-20 NOTE — Telephone Encounter (Signed)
Left voice message with negative results.

## 2011-02-20 NOTE — Telephone Encounter (Signed)
As discussed during the office visit, her pregnancy test is negative and so is urine.

## 2011-02-20 NOTE — Telephone Encounter (Signed)
Patient stated that she saw Padonda yesterday.Requesting urine test results form yesterday. Thanks.

## 2011-04-02 ENCOUNTER — Ambulatory Visit: Payer: BC Managed Care – PPO | Admitting: Internal Medicine

## 2011-04-08 ENCOUNTER — Encounter: Payer: Self-pay | Admitting: Internal Medicine

## 2011-04-08 ENCOUNTER — Ambulatory Visit (INDEPENDENT_AMBULATORY_CARE_PROVIDER_SITE_OTHER): Payer: BC Managed Care – PPO | Admitting: Internal Medicine

## 2011-04-08 VITALS — BP 100/70 | Temp 98.8°F | Wt 109.0 lb

## 2011-04-08 DIAGNOSIS — R233 Spontaneous ecchymoses: Secondary | ICD-10-CM

## 2011-04-08 MED ORDER — ALPRAZOLAM 0.25 MG PO TABS: 0.2500 mg | ORAL_TABLET | Freq: Two times a day (BID) | ORAL | Status: DC | PRN

## 2011-04-08 NOTE — Progress Notes (Signed)
  Subjective:    Patient ID: Janet Myers, female    DOB: Jan 12, 1976, 36 y.o.   MRN: [id number]  HPI  36 year old patient who is seen today due to a rash involving her lower extremities. Yesterday she noted a low-density petechial rash. She has not been ill although adjusting to the recent death of her father whose funeral was yesterday. She takes birth control pills but no other medications. She has been prescribed verapamil for cluster headache prophylaxis which he does not take    Review of Systems  Skin: Positive for rash.  Psychiatric/Behavioral: Positive for sleep disturbance and dysphoric mood.       Objective:   Physical Exam  Skin: Skin is warm and dry. Rash noted.       Scattered petechial lesions nonblanching involving her lower legs and ankles          Assessment & Plan:   Petechial rash. Patient appears quite healthy and has had no fever or constitutional complaints patient has been wearing tight stockings and I wonder if local trauma could not be a factor. We'll check a CBC to assess platelet count and clinically observe. She will call she develops any further symptoms or the rash does not resolve over the next 5-7 days

## 2011-04-08 NOTE — Patient Instructions (Signed)
Call or return to clinic prn if these symptoms worsen or fail to improve as anticipated.

## 2011-04-09 LAB — CBC WITH DIFFERENTIAL/PLATELET
Eosinophils Relative: 3.4 % (ref 0.0–5.0)
HCT: 37 % (ref 36.0–46.0)
Lymphocytes Relative: 51.4 % — ABNORMAL HIGH (ref 12.0–46.0)
Monocytes Relative: 5.7 % (ref 3.0–12.0)
Neutrophils Relative %: 38.7 % — ABNORMAL LOW (ref 43.0–77.0)
Platelets: 232 10*3/uL (ref 150.0–400.0)
WBC: 5.3 10*3/uL (ref 4.5–10.5)

## 2011-04-11 ENCOUNTER — Telehealth: Payer: Self-pay | Admitting: Internal Medicine

## 2011-04-11 NOTE — Telephone Encounter (Signed)
Please call/notify patient that lab/test/procedure is normal;  anemia has resolved 

## 2011-04-11 NOTE — Telephone Encounter (Signed)
Pt would like test results

## 2011-04-11 NOTE — Telephone Encounter (Signed)
Spoke with pt- informed  Labs normal - call if anything changes and she is concerned

## 2011-04-11 NOTE — Telephone Encounter (Signed)
Please advise 

## 2011-04-14 ENCOUNTER — Encounter (HOSPITAL_COMMUNITY): Payer: Self-pay | Admitting: Pharmacist

## 2011-04-16 ENCOUNTER — Encounter (HOSPITAL_COMMUNITY)
Admission: RE | Admit: 2011-04-16 | Discharge: 2011-04-16 | Disposition: A | Discharge status: Home / Self Care | Payer: BC Managed Care – PPO | Source: Ambulatory Visit | Attending: Obstetrics and Gynecology | Admitting: Obstetrics and Gynecology

## 2011-04-16 ENCOUNTER — Encounter (HOSPITAL_COMMUNITY): Payer: Self-pay

## 2011-04-16 HISTORY — DX: Anxiety disorder, unspecified: F41.9

## 2011-04-16 HISTORY — DX: Headache: R51

## 2011-04-16 LAB — CBC
HCT: 39 % (ref 36.0–46.0)
Hemoglobin: 13.1 g/dL (ref 12.0–15.0)
MCH: 27.6 pg (ref 26.0–34.0)
MCHC: 33.6 g/dL (ref 30.0–36.0)
MCV: 82.3 fL (ref 78.0–100.0)
RBC: 4.74 MIL/uL (ref 3.87–5.11)

## 2011-04-16 LAB — SURGICAL PCR SCREEN
MRSA, PCR: NEGATIVE
Staphylococcus aureus: NEGATIVE

## 2011-04-16 NOTE — H&P (Addendum)
36 yo G2P2 presents for surgical sterilization.    PMHx:  IBS, Asthma, UTIs, headaches PSHx:  Eye surgery, SVD, c-section All:  codeine (can tolerate percocet for pain Meds: loestrin, xanex prn SHx:  Negative for tobacco FHx:  Breast & colon ca, DM,   AF, VSS Gen - NAD CV - RRR Lungs - clear b Abd - soft, NT PV - deferred  A/P:  Desires sterilization.  Plan for L/S BTL Plan of care discussed again w/ pt and husband,.  Informed consent.

## 2011-04-16 NOTE — Patient Instructions (Addendum)
[address] MIESHA BACHMANN  04/16/2011   Your procedure is scheduled on:  04/21/11  Enter through the Main Entrance of [facility] at 1130 AM.  Pick up the phone at the desk and dial [phone].   Call this number if you have problems the morning of surgery: [phone]   Remember:   Do not eat food:.After midnight evening before surgery  Do not drink clear liquids: after 9am day of surgery.  Take these medicines the morning of surgery with A SIP OF WATER: NA   Do not wear jewelry, make-up or nail polish.  Do not wear lotions, powders, or perfumes. You may wear deodorant.  Do not shave 48 hours prior to surgery.  Do not bring valuables to the hospital.  Contacts, dentures or bridgework may not be worn into surgery.  Leave suitcase in the car. After surgery it may be brought to your room.  For patients admitted to the hospital, checkout time is 11:00 AM the day of discharge.   Patients discharged the day of surgery will not be allowed to drive home.  Name and phone number of your driver: NA  Special Instructions: CHG Shower Use Special Wash: 1/2 bottle night before surgery and 1/2 bottle morning of surgery.   Please read over the following fact sheets that you were given: MRSA Information

## 2011-04-20 MED ORDER — DEXTROSE 5 % IV SOLN
1.0000 g | INTRAVENOUS | Status: DC
  Filled 2011-04-20: qty 1

## 2011-04-21 ENCOUNTER — Ambulatory Visit (HOSPITAL_COMMUNITY)
Admission: AD | Admit: 2011-04-21 | Discharge: 2011-04-21 | Disposition: A | Discharge status: Home / Self Care | Payer: BC Managed Care – PPO | Source: Ambulatory Visit | Attending: Obstetrics and Gynecology | Admitting: Obstetrics and Gynecology

## 2011-04-21 ENCOUNTER — Encounter (HOSPITAL_COMMUNITY): Payer: Self-pay | Admitting: Anesthesiology

## 2011-04-21 ENCOUNTER — Ambulatory Visit (HOSPITAL_COMMUNITY): Payer: BC Managed Care – PPO | Admitting: Anesthesiology

## 2011-04-21 ENCOUNTER — Encounter (HOSPITAL_COMMUNITY)
Admission: AD | Disposition: A | Discharge status: Home / Self Care | Payer: Self-pay | Source: Ambulatory Visit | Attending: Obstetrics and Gynecology

## 2011-04-21 DIAGNOSIS — Z01812 Encounter for preprocedural laboratory examination: Secondary | ICD-10-CM | POA: Insufficient documentation

## 2011-04-21 DIAGNOSIS — Z302 Encounter for sterilization: Secondary | ICD-10-CM | POA: Insufficient documentation

## 2011-04-21 DIAGNOSIS — Z01818 Encounter for other preprocedural examination: Secondary | ICD-10-CM | POA: Insufficient documentation

## 2011-04-21 HISTORY — PX: LAPAROSCOPIC TUBAL LIGATION: SHX1937

## 2011-04-21 LAB — PREGNANCY, URINE: Preg Test, Ur: NEGATIVE

## 2011-04-21 SURGERY — LIGATION, FALLOPIAN TUBE, LAPAROSCOPIC
Anesthesia: General | Site: Abdomen | Laterality: Bilateral | Wound class: Clean Contaminated

## 2011-04-21 MED ORDER — FENTANYL CITRATE 0.05 MG/ML IJ SOLN: 25.0000 ug | INTRAMUSCULAR | Status: DC | PRN

## 2011-04-21 MED ORDER — FENTANYL CITRATE 0.05 MG/ML IJ SOLN
INTRAMUSCULAR | Status: DC | PRN
  Administered 2011-04-21 (×5): 50 ug via INTRAVENOUS

## 2011-04-21 MED ORDER — FENTANYL CITRATE 0.05 MG/ML IJ SOLN
INTRAMUSCULAR | Status: AC
  Filled 2011-04-21: qty 5

## 2011-04-21 MED ORDER — METOCLOPRAMIDE HCL 5 MG/ML IJ SOLN
10.0000 mg | Freq: Once | INTRAMUSCULAR | Status: AC
  Administered 2011-04-21: 10 mg via INTRAVENOUS

## 2011-04-21 MED ORDER — PROPOFOL 10 MG/ML IV EMUL
INTRAVENOUS | Status: DC | PRN
  Administered 2011-04-21: 150 mg via INTRAVENOUS

## 2011-04-21 MED ORDER — GLYCOPYRROLATE 0.2 MG/ML IJ SOLN
INTRAMUSCULAR | Status: DC | PRN
  Administered 2011-04-21: .8 mg via INTRAVENOUS
  Administered 2011-04-21: 0.1 mg via INTRAVENOUS

## 2011-04-21 MED ORDER — ROCURONIUM BROMIDE 100 MG/10ML IV SOLN
INTRAVENOUS | Status: DC | PRN
  Administered 2011-04-21: 40 mg via INTRAVENOUS

## 2011-04-21 MED ORDER — DEXTROSE 5 % IV SOLN
1.0000 g | INTRAVENOUS | Status: DC | PRN
  Administered 2011-04-21: 1 g via INTRAVENOUS

## 2011-04-21 MED ORDER — SCOPOLAMINE 1 MG/3DAYS TD PT72
MEDICATED_PATCH | TRANSDERMAL | Status: AC
  Administered 2011-04-21: 1.5 mg via TRANSDERMAL
  Filled 2011-04-21: qty 1

## 2011-04-21 MED ORDER — BUPIVACAINE-EPINEPHRINE PF 0.25-1:200000 % IJ SOLN
INTRAMUSCULAR | Status: AC
  Filled 2011-04-21: qty 30

## 2011-04-21 MED ORDER — LIDOCAINE HCL (CARDIAC) 20 MG/ML IV SOLN
INTRAVENOUS | Status: DC | PRN
  Administered 2011-04-21: 60 mg via INTRAVENOUS

## 2011-04-21 MED ORDER — PROMETHAZINE HCL 25 MG/ML IJ SOLN: 6.2500 mg | Freq: Once | INTRAMUSCULAR | Status: DC

## 2011-04-21 MED ORDER — ONDANSETRON HCL 4 MG/2ML IJ SOLN
INTRAMUSCULAR | Status: DC | PRN
  Administered 2011-04-21: 4 mg via INTRAVENOUS

## 2011-04-21 MED ORDER — MIDAZOLAM HCL 5 MG/5ML IJ SOLN
INTRAMUSCULAR | Status: DC | PRN
  Administered 2011-04-21: 2 mg via INTRAVENOUS

## 2011-04-21 MED ORDER — PROMETHAZINE HCL 25 MG/ML IJ SOLN
INTRAMUSCULAR | Status: AC
  Filled 2011-04-21: qty 1

## 2011-04-21 MED ORDER — MIDAZOLAM HCL 2 MG/2ML IJ SOLN
INTRAMUSCULAR | Status: AC
  Filled 2011-04-21: qty 2

## 2011-04-21 MED ORDER — LACTATED RINGERS IV SOLN
INTRAVENOUS | Status: DC
  Administered 2011-04-21 (×2): via INTRAVENOUS
  Administered 2011-04-21: 50 mL/h via INTRAVENOUS

## 2011-04-21 MED ORDER — ONDANSETRON HCL 4 MG/2ML IJ SOLN
4.0000 mg | Freq: Once | INTRAMUSCULAR | Status: AC
  Administered 2011-04-21: 4 mg via INTRAVENOUS

## 2011-04-21 MED ORDER — METOCLOPRAMIDE HCL 5 MG/ML IJ SOLN
INTRAMUSCULAR | Status: AC
  Administered 2011-04-21: 10 mg via INTRAVENOUS
  Filled 2011-04-21: qty 2

## 2011-04-21 MED ORDER — SCOPOLAMINE 1 MG/3DAYS TD PT72
1.0000 | MEDICATED_PATCH | TRANSDERMAL | Status: DC
  Administered 2011-04-21: 1.5 mg via TRANSDERMAL

## 2011-04-21 MED ORDER — ONDANSETRON HCL 4 MG/2ML IJ SOLN
INTRAMUSCULAR | Status: AC
  Administered 2011-04-21: 4 mg via INTRAVENOUS
  Filled 2011-04-21: qty 2

## 2011-04-21 MED ORDER — NEOSTIGMINE METHYLSULFATE 1 MG/ML IJ SOLN
INTRAMUSCULAR | Status: DC | PRN
  Administered 2011-04-21: 4 mg via INTRAVENOUS
  Administered 2011-04-21: 1 mg via INTRAVENOUS

## 2011-04-21 MED ORDER — BUPIVACAINE HCL (PF) 0.25 % IJ SOLN
INTRAMUSCULAR | Status: DC | PRN
  Administered 2011-04-21: 3 mL

## 2011-04-21 MED ORDER — OXYCODONE-ACETAMINOPHEN 10-325 MG PO TABS
1.0000 | ORAL_TABLET | ORAL | Status: AC | PRN
Start: 2011-04-21 — End: 2011-05-01

## 2011-04-21 MED ORDER — GLYCOPYRROLATE 0.2 MG/ML IJ SOLN
INTRAMUSCULAR | Status: AC
  Filled 2011-04-21: qty 1

## 2011-04-21 MED ORDER — KETOROLAC TROMETHAMINE 30 MG/ML IJ SOLN
INTRAMUSCULAR | Status: DC | PRN
  Administered 2011-04-21: 60 mg via INTRAVENOUS

## 2011-04-21 MED ORDER — DEXAMETHASONE SODIUM PHOSPHATE 4 MG/ML IJ SOLN
INTRAMUSCULAR | Status: DC | PRN
  Administered 2011-04-21: 10 mg via INTRAVENOUS

## 2011-04-21 MED ORDER — NEOSTIGMINE METHYLSULFATE 1 MG/ML IJ SOLN
INTRAMUSCULAR | Status: AC
  Filled 2011-04-21: qty 10

## 2011-04-21 SURGICAL SUPPLY — 23 items
CATH ROBINSON RED A/P 16FR (CATHETERS) ×2 IMPLANT
CHLORAPREP W/TINT 26ML (MISCELLANEOUS) ×4 IMPLANT
CLIP FILSHIE TUBAL LIGA STRL (Clip) ×2 IMPLANT
CLOTH BEACON ORANGE TIMEOUT ST (SAFETY) ×2 IMPLANT
DERMABOND ADHESIVE PROPEN (GAUZE/BANDAGES/DRESSINGS) ×1
DERMABOND ADVANCED (GAUZE/BANDAGES/DRESSINGS) ×1
DERMABOND ADVANCED .7 DNX12 (GAUZE/BANDAGES/DRESSINGS) ×1 IMPLANT
DERMABOND ADVANCED .7 DNX6 (GAUZE/BANDAGES/DRESSINGS) ×1 IMPLANT
GLOVE BIO SURGEON STRL SZ 6.5 (GLOVE) ×2 IMPLANT
GLOVE BIOGEL PI IND STRL 7.0 (GLOVE) ×2 IMPLANT
GLOVE BIOGEL PI INDICATOR 7.0 (GLOVE) ×2
GOWN PREVENTION PLUS LG XLONG (DISPOSABLE) ×4 IMPLANT
PACK LAPAROSCOPY BASIN (CUSTOM PROCEDURE TRAY) ×2 IMPLANT
SET IRRIG TUBING LAPAROSCOPIC (IRRIGATION / IRRIGATOR) IMPLANT
SLEEVE ADV FIXATION 5X100MM (TROCAR) IMPLANT
SUT VIC AB 3-0 PS2 18 (SUTURE) ×1
SUT VIC AB 3-0 PS2 18XBRD (SUTURE) ×1 IMPLANT
SUT VICRYL 0 UR6 27IN ABS (SUTURE) ×2 IMPLANT
TOWEL OR 17X24 6PK STRL BLUE (TOWEL DISPOSABLE) ×4 IMPLANT
TROCAR Z-THREAD BLADED 5X100MM (TROCAR) IMPLANT
TROCAR Z-THREAD FIOS 11X100 BL (TROCAR) ×2 IMPLANT
WARMER LAPAROSCOPE (MISCELLANEOUS) ×2 IMPLANT
WATER STERILE IRR 1000ML POUR (IV SOLUTION) ×2 IMPLANT

## 2011-04-21 NOTE — OR Nursing (Signed)
04-21-11 Filshie Clips implanted @ bilateral fallopian tubes in OR suite per Dr. Renaldo Fiddler. Lot/Serial # [id number]. Exp. Date 09-27-13.

## 2011-04-21 NOTE — Anesthesia Postprocedure Evaluation (Addendum)
  Anesthesia Post-op Note  Patient: Janet Myers  Procedure(s) Performed: Procedure(s) (LRB): LAPAROSCOPIC TUBAL LIGATION (Bilateral)  Patient Location: PACU  Anesthesia Type: General  Level of Consciousness: awake, alert  and oriented  Airway and Oxygen Therapy: Patient Spontanous Breathing  Post-op Pain: none  Post-op Assessment: Post-op Vital signs reviewed, Patient's Cardiovascular Status Stable, Respiratory Function Stable, Patent Airway, No signs of Nausea or vomiting and Pain level controlled  Post-op Vital Signs: Reviewed and stable  Complications: No apparent anesthesia complications

## 2011-04-21 NOTE — Transfer of Care (Signed)
Immediate Anesthesia Transfer of Care Note  Patient: Janet Myers  Procedure(s) Performed: Procedure(s) (LRB): LAPAROSCOPIC TUBAL LIGATION (Bilateral)  Patient Location: PACU  Anesthesia Type: General  Level of Consciousness: alert  and oriented  Airway & Oxygen Therapy: Patient Spontanous Breathing and Patient connected to nasal cannula oxygen  Post-op Assessment: Report given to PACU RN and Post -op Vital signs reviewed and stable  Post vital signs: stable  Complications: No apparent anesthesia complications

## 2011-04-21 NOTE — Op Note (Signed)
Janet Myers, Janet Myers             ACCOUNT NO.:  [id number]  MEDICAL RECORD NO.:  [id number]  LOCATION:  [facility]                          FACILITY:  [facility]  PHYSICIAN:  Zelphia Cairo, MD    DATE OF BIRTH:  1975-03-05  DATE OF PROCEDURE:  04/21/2011 DATE OF DISCHARGE:                              OPERATIVE REPORT   PREOPERATIVE DIAGNOSIS:  Desires permanent sterilization.  POSTOPERATIVE DIAGNOSIS:  Desires permanent sterilization.  PROCEDURE:  Laparoscopic bilateral tubal ligation with Filshie clips.  SURGEON:  Zelphia Cairo, MD.  ANESTHESIA:  General.  ANESTHESIOLOGIST:  Belva Agee, MD.  COMPLICATIONS:  None.  CONDITION:  Stable to recovery room.  PROCEDURE IN DETAIL:  The patient was taken to the operating room after informed consent was obtained.  She was given general anesthesia and placed in the dorsal lithotomy position using Allen stirrups, prepped and draped in sterile fashion, and an in and out catheter was used to drain her bladder.  Bivalve speculum was placed in the vagina and a single-tooth tenaculum was placed on the anterior lip of the cervix. The Hulka clamp was placed on the cervix.  Tenaculum and speculum were removed and our attention was turned to the abdomen.  Marcaine 0.25% was used to provide local anesthesia at the site of our infraumbilical skin incision.  Skin incision was made with a scalpel and extended bluntly to the level of the fascia using a Kelly clamp. Optical trocar was then inserted under direct visualization.  Once the intraperitoneal placement was confirmed, CO2 was turned on and the abdomen and pelvis were insufflated.  Right upper quadrant appeared normal.  Bilateral fallopian tubes and ovaries were visualized and appeared normal.  Filshie clips were placed bilaterally.  All instruments were then removed from the abdomen.  A deep stitch was placed in the infraumbilical skin incision.  The skin was reapproximated with Vicryl and  Dermabond was placed over the incision.  The Hulka clamp was removed.  The patient was extubated and taken to the recovery room in stable condition.  Sponge, lap, needle, and instrument counts were correct x2.     Zelphia Cairo, MD     GA/MEDQ  D:  04/21/2011  T:  04/21/2011  Job:  [id number]

## 2011-04-21 NOTE — Anesthesia Postprocedure Evaluation (Deleted)
  Anesthesia Post-op Note  Patient: Lenola M Croak  Procedure(s) Performed: Procedure(s) (LRB): LAPAROSCOPIC TUBAL LIGATION (Bilateral)  Patient Location: PACU  Anesthesia Type: Spinal  Level of Consciousness: awake, alert  and oriented  Airway and Oxygen Therapy: Patient Spontanous Breathing  Post-op Pain: none  Post-op Assessment: Post-op Vital signs reviewed, Patient's Cardiovascular Status Stable, Respiratory Function Stable, Patent Airway, No signs of Nausea or vomiting, Pain level controlled, No headache and No backache  Post-op Vital Signs: Reviewed and stable  Complications: No apparent anesthesia complications

## 2011-04-21 NOTE — Discharge Instructions (Signed)

## 2011-04-21 NOTE — Progress Notes (Signed)
Pt states nausea much better

## 2011-04-21 NOTE — Anesthesia Preprocedure Evaluation (Signed)

## 2011-04-22 ENCOUNTER — Encounter (HOSPITAL_COMMUNITY): Payer: Self-pay | Admitting: Obstetrics and Gynecology

## 2011-10-31 ENCOUNTER — Telehealth: Payer: Self-pay | Admitting: Internal Medicine

## 2011-10-31 NOTE — Telephone Encounter (Signed)
Caller: Maretta/Patient; Patient Name: Janet Myers; PCP: [facility]on[facility]); Best Callback Phone Number: [phone].  Patient states onset last night , 10/30/11, She began having upper stomach and between ribs. Last twenty minutes. "burning pain". She states it occurred after Intercourse with her husband.  Went away on its own.  The pain returned today, while sitting at her desk.  Upper epigastric, between rib cage that radiated to her back.  Worse with movement , breathing. Last 2 1/2 hours.  She states it occurred after breakfast of Cinnamon Toast and pain started at 09:00 a.m.Marland Kitchen  She is no longer experiencing the pain or discomfort. LMP-Tubal Ligation/ LMP 2 weeks ago  . No vaginal discharge, no urinary discomfort.  At present calm and relaxed and no pain. Emergent s/sx ruled out per Abdominal Pain protocol with exception to "Repeated episodes of abdominal discomfort and no previous evaluation by Provider".  See Provider in 2 weeks.  Home care instructions reviewed . Understanding expressed . Encouraged to mointor symptoms,Call back for questions, changes or concerns. She will call office to schedule an evaluation.

## 2011-11-03 ENCOUNTER — Ambulatory Visit (INDEPENDENT_AMBULATORY_CARE_PROVIDER_SITE_OTHER): Payer: BC Managed Care – PPO | Admitting: Internal Medicine

## 2011-11-03 ENCOUNTER — Encounter: Payer: Self-pay | Admitting: Internal Medicine

## 2011-11-03 VITALS — BP 100/70 | Temp 98.4°F | Wt 117.0 lb

## 2011-11-03 DIAGNOSIS — R109 Unspecified abdominal pain: Secondary | ICD-10-CM

## 2011-11-03 NOTE — Patient Instructions (Addendum)
Gallbladder ultrasound as discussed  Call or return to clinic prn if these symptoms worsen or fail to improve as anticipated.

## 2011-11-03 NOTE — Progress Notes (Signed)
  Subjective:    Patient ID: Janet Myers, female    DOB: 01/05/1976, 36 y.o.   MRN: [id number]  HPI  36 year old patient who presents for evaluation of abdominal pain. She had the onset of some epigastric pain 4 days ago during the evening. It was intermittent at that time. 3 days ago while at work she had 2 hours of severe stabbing epigastric pain with radiation to the back. Pain is aggravated by deep inspiration. Over the weekend she had mild nausea but no recurrent abdominal pain. She states that over the past 6 months she's had 2 or 3 episodes of similar but less severe epigastric discomfort. She states her mother has a history of gallbladder disease    Review of Systems  Constitutional: Negative.   HENT: Negative for hearing loss, congestion, sore throat, rhinorrhea, dental problem, sinus pressure and tinnitus.   Eyes: Negative for pain, discharge and visual disturbance.  Respiratory: Negative for cough and shortness of breath.   Cardiovascular: Negative for chest pain, palpitations and leg swelling.  Gastrointestinal: Positive for nausea and abdominal pain. Negative for vomiting, diarrhea, constipation, blood in stool and abdominal distention.  Genitourinary: Negative for dysuria, urgency, frequency, hematuria, flank pain, vaginal bleeding, vaginal discharge, difficulty urinating, vaginal pain and pelvic pain.  Musculoskeletal: Negative for joint swelling, arthralgias and gait problem.  Skin: Negative for rash.  Neurological: Negative for dizziness, syncope, speech difficulty, weakness, numbness and headaches.  Hematological: Negative for adenopathy.  Psychiatric/Behavioral: Negative for behavioral problems, dysphoric mood and agitation. The patient is not nervous/anxious.        Objective:   Physical Exam  Constitutional: She is oriented to person, place, and time. She appears well-developed and well-nourished.  HENT:  Head: Normocephalic.  Right Ear: External ear normal.    Left Ear: External ear normal.  Mouth/Throat: Oropharynx is clear and moist.  Eyes: Conjunctivae normal and EOM are normal. Pupils are equal, round, and reactive to light.  Neck: Normal range of motion. Neck supple. No thyromegaly present.  Cardiovascular: Normal rate, regular rhythm, normal heart sounds and intact distal pulses.   Pulmonary/Chest: Effort normal and breath sounds normal.  Abdominal: Soft. Bowel sounds are normal. She exhibits no distension and no mass. There is no tenderness. There is no rebound and no guarding.  Musculoskeletal: Normal range of motion.  Lymphadenopathy:    She has no cervical adenopathy.  Neurological: She is alert and oriented to person, place, and time.  Skin: Skin is warm and dry. No rash noted.  Psychiatric: She has a normal mood and affect. Her behavior is normal.          Assessment & Plan:   History recurrent epigastric pain and normal clinical exam. Family history of gallbladder disease. Will screen with a gallbladder ultrasound and observe

## 2011-11-04 ENCOUNTER — Encounter (HOSPITAL_COMMUNITY): Payer: Self-pay | Admitting: Emergency Medicine

## 2011-11-04 ENCOUNTER — Other Ambulatory Visit: Payer: Self-pay | Admitting: Internal Medicine

## 2011-11-04 ENCOUNTER — Ambulatory Visit
Admission: RE | Admit: 2011-11-04 | Discharge: 2011-11-04 | Disposition: A | Discharge status: Home / Self Care | Payer: BC Managed Care – PPO | Source: Ambulatory Visit | Attending: Internal Medicine | Admitting: Internal Medicine

## 2011-11-04 DIAGNOSIS — K8020 Calculus of gallbladder without cholecystitis without obstruction: Secondary | ICD-10-CM | POA: Insufficient documentation

## 2011-11-04 DIAGNOSIS — R1013 Epigastric pain: Secondary | ICD-10-CM | POA: Insufficient documentation

## 2011-11-04 DIAGNOSIS — R10816 Epigastric abdominal tenderness: Secondary | ICD-10-CM | POA: Insufficient documentation

## 2011-11-04 DIAGNOSIS — R109 Unspecified abdominal pain: Secondary | ICD-10-CM

## 2011-11-04 DIAGNOSIS — M549 Dorsalgia, unspecified: Secondary | ICD-10-CM | POA: Insufficient documentation

## 2011-11-04 DIAGNOSIS — F411 Generalized anxiety disorder: Secondary | ICD-10-CM | POA: Insufficient documentation

## 2011-11-04 LAB — COMPREHENSIVE METABOLIC PANEL
ALT: 47 U/L — ABNORMAL HIGH (ref 0–35)
AST: 66 U/L — ABNORMAL HIGH (ref 0–37)
Alkaline Phosphatase: 57 U/L (ref 39–117)
CO2: 28 mEq/L (ref 19–32)
Chloride: 101 mEq/L (ref 96–112)
GFR calc Af Amer: >90 mL/min (ref >90)
GFR calc non Af Amer: >90 mL/min (ref >90)
Glucose, Bld: 105 mg/dL — ABNORMAL HIGH (ref 70–99)
Potassium: 3.6 mEq/L (ref 3.5–5.1)
Sodium: 139 mEq/L (ref 135–145)

## 2011-11-04 LAB — CBC WITH DIFFERENTIAL/PLATELET
Basophils Absolute: 0 10*3/uL (ref 0.0–0.1)
Basophils Relative: 0 % (ref 0–1)
Eosinophils Absolute: 0.1 10*3/uL (ref 0.0–0.7)
Hemoglobin: 12.9 g/dL (ref 12.0–15.0)
MCH: 29.1 pg (ref 26.0–34.0)
MCHC: 35 g/dL (ref 30.0–36.0)
Monocytes Absolute: 0.5 10*3/uL (ref 0.1–1.0)
Monocytes Relative: 6 % (ref 3–12)
Neutro Abs: 2.7 10*3/uL (ref 1.7–7.7)
Neutrophils Relative %: 36 % — ABNORMAL LOW (ref 43–77)
RDW: 12.7 % (ref 11.5–15.5)

## 2011-11-04 NOTE — ED Notes (Signed)
PT. REPORTS UPPER ABDOMINAL PAIN / MID BACK PAIN WITH SLIGHT NAUSEA , SEEN BY HER PCP YESTERDAY , RECEIVED RESULT OF ULTRASOUND TODAY - GALLSTONES.

## 2011-11-04 NOTE — Progress Notes (Signed)
Quick Note:  Spoke with pt- informed of results - will do referral per pt request ______

## 2011-11-05 ENCOUNTER — Emergency Department (HOSPITAL_COMMUNITY)
Admission: EM | Admit: 2011-11-05 | Discharge: 2011-11-05 | Disposition: A | Discharge status: Home / Self Care | Payer: BC Managed Care – PPO | Attending: Emergency Medicine | Admitting: Emergency Medicine

## 2011-11-05 ENCOUNTER — Telehealth: Payer: Self-pay | Admitting: Family Medicine

## 2011-11-05 DIAGNOSIS — K8020 Calculus of gallbladder without cholecystitis without obstruction: Secondary | ICD-10-CM

## 2011-11-05 HISTORY — DX: Calculus of gallbladder without cholecystitis without obstruction: K80.20

## 2011-11-05 LAB — URINALYSIS, ROUTINE W REFLEX MICROSCOPIC
Glucose, UA: NEGATIVE mg/dL
Specific Gravity, Urine: 1.018 (ref 1.005–1.030)
pH: 7 (ref 5.0–8.0)

## 2011-11-05 LAB — URINE MICROSCOPIC-ADD ON

## 2011-11-05 MED ORDER — HYDROMORPHONE HCL PF 1 MG/ML IJ SOLN
1.0000 mg | Freq: Once | INTRAMUSCULAR | Status: AC
  Administered 2011-11-05: 1 mg via INTRAVENOUS
  Filled 2011-11-05: qty 1

## 2011-11-05 MED ORDER — LORAZEPAM 2 MG/ML IJ SOLN
0.5000 mg | Freq: Once | INTRAMUSCULAR | Status: AC
  Administered 2011-11-05: 0.5 mg via INTRAVENOUS
  Filled 2011-11-05: qty 1

## 2011-11-05 MED ORDER — OXYCODONE-ACETAMINOPHEN 5-325 MG PO TABS: 2.0000 | ORAL_TABLET | ORAL | Status: DC | PRN

## 2011-11-05 MED ORDER — ONDANSETRON 8 MG PO TBDP: 8.0000 mg | ORAL_TABLET | Freq: Three times a day (TID) | ORAL | Status: DC | PRN

## 2011-11-05 NOTE — Progress Notes (Signed)
I dont know how this got on rachel's desk top

## 2011-11-05 NOTE — Telephone Encounter (Signed)
[facility] Triage Call Report Triage Record Num: [id number] Operator: Patriciaann Clan Patient Name: Janet Myers Call Date & Time: 11/04/2011 8:19:31PM Patient Phone: [phone] PCP: Janet Myers Patient Gender: Female PCP Fax : [phone] Patient DOB: February 25, 1975 Practice Name: Janet [facility] Reason for Call: Caller: Janet Myers/Patient; PCP: Janet Myers (Family Practice); CB#: [phone]; LMP 10/21/11. Patient states she has a gallbladder ultrasound 11/04/11 due to abdominal pain. States was diagnosed with gallstones. Patient states she developed pain in epigastric area, radiating into back. Onset 11/04/11 1630. States pain progressively worse. Describes pain as "achy, sharp." States pain is constant. Denies nuasea or vomiting. Afebrile. States last bowel movement 11/03/11, formed , brown in color. Patient states pain is unbearable. Triage per Abdominal Pain Protocol. Care advice given per guidelines related to positive triage assessment for " Unbearable abdominal pain." Patient advised not to take anything by mouth. Patient advised to go to Emergency Department now for evaluation. Advised not to drive self. Patient verbalizes understanding and agreeable. Patient states she will go to [facility] ED for evaluation. Protocol(s) Used: Abdominal Pain Recommended Outcome per Protocol: See ED Immediately Reason for Outcome: Unbearable abdominal/pelvic pain Care Advice: ~ Allow the patient to be in a position of comfort. ~ Another adult should drive. ~ Do not eat or drink anything until evaluated by provider. Call EMS 911 if signs and symptoms of shock develop (such as unable to stand due to faintness, dizziness, or lightheadedness; new onset of confusion; slow to respond or difficult to awaken; skin is pale, gray, cool, or moist to touch; severe weakness; loss of consciousness). ~ ~ IMMEDIATE ACTION Write down provider's name. List or place the following in a bag  for transport with the patient: current prescription and/or nonprescription medications; alternative treatments, therapies and medications; and street drugs. ~

## 2011-11-05 NOTE — ED Provider Notes (Signed)
History     CSN: [id number]  Arrival date & time 11/04/11  2236   First MD Initiated Contact with Patient 11/05/11 0045      Chief Complaint  Patient presents with  . Abdominal Pain    (Consider location/radiation/quality/duration/timing/severity/associated sxs/prior treatment) Patient is a 36 y.o. female presenting with abdominal pain. The history is provided by the patient.  Abdominal Pain The primary symptoms of the illness include abdominal pain.   patient here with epigastric pain that radiates to her back x3 days. Symptoms worse after eating went to her primary care Dr. and had an outpatient ultrasound which showed cholelithiasis. She denies any fever or vomiting. Pain is colicky and located at her right upper quadrant radiation to her epigastric area but her back. No urinary symptoms. Denies any vaginal bleeding or discharge. Notes sx worse with food  Past Medical History  Diagnosis Date  . Headache   . Anxiety     father recently passed away  . Gallstones     Past Surgical History  Procedure Date  . Eye surgery   . Cesarean section   . Laparoscopic tubal ligation 04/21/2011    Procedure: LAPAROSCOPIC TUBAL LIGATION;  Surgeon: Zelphia Cairo, MD;  Location: [facility];  Service: Gynecology;  Laterality: Bilateral;  Bilateral with Filshie Clips    Family History  Problem Relation Age of Onset  . Depression Mother   . Hepatitis Mother     c  . Osteopenia Mother   . Alcohol abuse Father   . COPD Father   . Heart disease Father   . Cirrhosis Father   . Hepatitis Father     c  . Osteopenia Father     History  Substance Use Topics  . Smoking status: Never Smoker   . Smokeless tobacco: Never Used  . Alcohol Use: Yes    OB History    Grav Para Term Preterm Abortions TAB SAB Ect Mult Living                  Review of Systems  Gastrointestinal: Positive for abdominal pain.  All other systems reviewed and are negative.    Allergies  Codeine  Home  Medications   Current Outpatient Rx  Name Route Sig Dispense Refill  . IBUPROFEN 200 MG PO TABS Oral Take 600-800 mg by mouth 2 (two) times daily as needed. For pain/headache      BP 134/77  Pulse 67  Temp 97.7 F (36.5 C) (Oral)  Resp 18  SpO2 99%  LMP 10/21/2011  Physical Exam  Nursing note and vitals reviewed. Constitutional: She is oriented to person, place, and time. She appears well-developed and well-nourished.  Non-toxic appearance. No distress.  HENT:  Head: Normocephalic and atraumatic.  Eyes: Conjunctivae normal, EOM and lids are normal. Pupils are equal, round, and reactive to light.  Neck: Normal range of motion. Neck supple. No tracheal deviation present. No mass present.  Cardiovascular: Normal rate, regular rhythm and normal heart sounds.  Exam reveals no gallop.   No murmur heard. Pulmonary/Chest: Effort normal and breath sounds normal. No stridor. No respiratory distress. She has no decreased breath sounds. She has no wheezes. She has no rhonchi. She has no rales.  Abdominal: Soft. Normal appearance and bowel sounds are normal. She exhibits no distension. There is tenderness in the epigastric area. There is no rigidity, no rebound, no guarding and no CVA tenderness.  Musculoskeletal: Normal range of motion. She exhibits no edema and no tenderness.  Neurological: She is alert and oriented to person, place, and time. She has normal strength. No cranial nerve deficit or sensory deficit. GCS eye subscore is 4. GCS verbal subscore is 5. GCS motor subscore is 6.  Skin: Skin is warm and dry. No abrasion and no rash noted.  Psychiatric: She has a normal mood and affect. Her speech is normal and behavior is normal.    ED Course  Procedures (including critical care time)  Labs Reviewed  COMPREHENSIVE METABOLIC PANEL - Abnormal; Notable for the following:    Glucose, Bld 105 (*)     AST 66 (*)     ALT 47 (*)     All other components within normal limits  CBC WITH  DIFFERENTIAL - Abnormal; Notable for the following:    Neutrophils Relative 36 (*)     Lymphocytes Relative 56 (*)     Lymphs Abs 4.2 (*)     All other components within normal limits  URINALYSIS, ROUTINE W REFLEX MICROSCOPIC  LIPASE, BLOOD   US Abdomen Limited Ruq  11/04/2011  *RADIOLOGY REPORT*  Clinical Data:  Abdominal pain.  LIMITED ABDOMINAL ULTRASOUND - RIGHT UPPER QUADRANT  Comparison:  None.  Findings:  Gallbladder:  Multiple mobile gallstones, the largest 1.5 cm.  No wall thickening or sonographic Murphy's.  Common bile duct:  Upper limits normal in caliber at 6 mm.  No visible ductal stones.  Liver:  Normal size and echotexture.  No focal abnormality.  No biliary ductal dilatation.  IMPRESSION: Cholelithiasis.  No sonographic evidence of acute cholecystitis.                    Original Report Authenticated By: Cyndie Chime, M.D.      No diagnosis found.    MDM  Patient with known cholelithiasis and ultrasound today. No evidence of leukocytosis. Lipase is normal. Given pain medication and does fill better. Will give her referral to general surgeon on call        Toy Baker, MD 11/05/11 [phone]

## 2011-11-11 ENCOUNTER — Ambulatory Visit (INDEPENDENT_AMBULATORY_CARE_PROVIDER_SITE_OTHER): Payer: BC Managed Care – PPO | Admitting: General Surgery

## 2011-11-11 ENCOUNTER — Encounter (INDEPENDENT_AMBULATORY_CARE_PROVIDER_SITE_OTHER): Payer: Self-pay | Admitting: General Surgery

## 2011-11-11 VITALS — BP 100/56 | HR 72 | Temp 98.3°F | Resp 16 | Ht 61.0 in | Wt 115.8 lb

## 2011-11-11 DIAGNOSIS — K8020 Calculus of gallbladder without cholecystitis without obstruction: Secondary | ICD-10-CM

## 2011-11-11 NOTE — Progress Notes (Signed)
Patient ID: Janet Myers, female   DOB: March 08, 1975, 36 y.o.   MRN: [id number]  Chief Complaint  Patient presents with  . Pre-op Exam    eval gallstones    HPI Janet Myers is a 36 y.o. female referred by Dr. Amador Cunas For abdominal pain in symptomatic gallstones. Patient states that she mostly had tach of the last several weeks. She states her pain was severe enough to send to the ER. Patient underwent ultrasound which revealed multiple gallstones clinicals to 1.5 cm without any gallbladder wall thickening. Patient had slightly elevated LFTs however no elevated T. Bili or amylase/lipase.  Secondary to ongoing abdominal pain the patient would like to proceed with elective cholecystectomy. HPI  Past Medical History  Diagnosis Date  . Headache   . Anxiety     father recently passed away  . Gallstones     Past Surgical History  Procedure Date  . Eye surgery   . Cesarean section   . Laparoscopic tubal ligation 04/21/2011    Procedure: LAPAROSCOPIC TUBAL LIGATION;  Surgeon: Zelphia Cairo, MD;  Location: [facility];  Service: Gynecology;  Laterality: Bilateral;  Bilateral with Filshie Clips    Family History  Problem Relation Age of Onset  . Depression Mother   . Hepatitis Mother     c  . Osteopenia Mother   . Alcohol abuse Father   . COPD Father   . Heart disease Father   . Cirrhosis Father   . Hepatitis Father     c  . Osteopenia Father     Social History History  Substance Use Topics  . Smoking status: Never Smoker   . Smokeless tobacco: Never Used  . Alcohol Use: Yes    Allergies  Allergen Reactions  . Codeine Nausea Only    Can take Percocet (not Vicodin)    Current Outpatient Prescriptions  Medication Sig Dispense Refill  . ibuprofen (ADVIL,MOTRIN) 200 MG tablet Take 600-800 mg by mouth 2 (two) times daily as needed. For pain/headache      . ondansetron (ZOFRAN ODT) 8 MG disintegrating tablet Take 1 tablet (8 mg total) by mouth every 8 (eight) hours  as needed for nausea.  20 tablet  0  . oxyCODONE-acetaminophen (PERCOCET/ROXICET) 5-325 MG per tablet Take 2 tablets by mouth every 4 (four) hours as needed for pain.  20 tablet  0  . DISCONTD: JUNEL FE 1/20 1-20 MG-MCG tablet Take 1 tablet by mouth daily. Loestrin      . DISCONTD: verapamil (CALAN) 120 MG tablet Take 120 mg by mouth 3 (three) times daily.         Review of Systems Review of Systems  Constitutional: Negative.   HENT: Negative.   Eyes: Negative.   Respiratory: Negative.   Cardiovascular: Negative.   Gastrointestinal: Positive for abdominal pain.  Musculoskeletal: Negative.   Neurological: Negative.     Blood pressure 100/56, pulse 72, temperature 98.3 F (36.8 C), temperature source Temporal, resp. rate 16, height 5\' 1"  (1.549 m), weight 115 lb 12.8 oz (52.527 kg), last menstrual period 10/21/2011.  Physical Exam Physical Exam  Constitutional: She is oriented to person, place, and time. She appears well-developed and well-nourished.  HENT:  Head: Normocephalic and atraumatic.  Eyes: Conjunctivae normal and EOM are normal. Pupils are equal, round, and reactive to light.  Neck: Normal range of motion. Neck supple.  Cardiovascular: Normal rate and regular rhythm.   Pulmonary/Chest: Effort normal and breath sounds normal.  Abdominal: Soft. Bowel sounds are normal.  She exhibits no distension. There is no tenderness. There is no rebound and no guarding.  Musculoskeletal: Normal range of motion.  Neurological: She is alert and oriented to person, place, and time.    Data Reviewed Ultrasound laboratory studies reviewed  Assessment    36 year old female with symptomatic cholelithiasis.    Plan    1. We'll proceed with laparoscopic cholecystectomy with intraoperative cholangiogram.  2. All risks and benefits were discussed with the patient, to generally include infection, bleeding, damage to surrounding structures, and recurrence. Alternatives were offered and  described.  All questions were answered and the patient voiced understanding of the procedure and wishes to proceed at this point.        Marigene Ehlers., Adrienne Trombetta 11/11/2011, 3:29 PM

## 2011-11-17 ENCOUNTER — Other Ambulatory Visit (INDEPENDENT_AMBULATORY_CARE_PROVIDER_SITE_OTHER): Payer: Self-pay | Admitting: General Surgery

## 2011-11-17 DIAGNOSIS — K8010 Calculus of gallbladder with chronic cholecystitis without obstruction: Secondary | ICD-10-CM

## 2011-11-17 HISTORY — PX: LAPAROSCOPIC CHOLECYSTECTOMY: SUR755

## 2011-11-19 ENCOUNTER — Telehealth (INDEPENDENT_AMBULATORY_CARE_PROVIDER_SITE_OTHER): Payer: Self-pay

## 2011-11-19 NOTE — Telephone Encounter (Signed)
Patient called with post op questions. She asked if she could take bandages off and just leave the steri strips on. I told her yes and when she showers just let soap and water run over area and pat dry. She asked about lifting. I told her no lifting over 15-20lbs. She has a 56.35 year old. I told her we understand you have to sometime pick him up but to be careful when she does and to lift with legs limiting using her abdominal muscles. She asked about driving. I told her as long as she is not on any pain medicine and she can comfortably sit in a car without pain she could. She asked if she needed to wait until follow up to go back to work. 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 She does not need a note work at this time.

## 2011-11-20 ENCOUNTER — Ambulatory Visit (INDEPENDENT_AMBULATORY_CARE_PROVIDER_SITE_OTHER): Payer: BC Managed Care – PPO | Admitting: General Surgery

## 2011-12-01 ENCOUNTER — Encounter (INDEPENDENT_AMBULATORY_CARE_PROVIDER_SITE_OTHER): Payer: Self-pay | Admitting: General Surgery

## 2011-12-03 ENCOUNTER — Encounter (INDEPENDENT_AMBULATORY_CARE_PROVIDER_SITE_OTHER): Payer: Self-pay | Admitting: General Surgery

## 2011-12-03 ENCOUNTER — Ambulatory Visit (INDEPENDENT_AMBULATORY_CARE_PROVIDER_SITE_OTHER): Payer: BC Managed Care – PPO | Admitting: General Surgery

## 2011-12-03 VITALS — BP 116/68 | HR 68 | Temp 98.9°F | Resp 16 | Ht 61.0 in | Wt 114.4 lb

## 2011-12-03 DIAGNOSIS — Z9049 Acquired absence of other specified parts of digestive tract: Secondary | ICD-10-CM

## 2011-12-03 DIAGNOSIS — Z9889 Other specified postprocedural states: Secondary | ICD-10-CM

## 2011-12-03 NOTE — Progress Notes (Signed)
Patient ID: Janet Myers, female   DOB: Nov 06, 1975, 36 y.o.   MRN: [id number] The patient is a 36 year old female status post left cholecystectomy. Patient did well postoperatively has been having normal bowel function and did a normal diet. His pathology revealed chronic cholecystitis with cholelithiasis which was discussed with the patient.  On exam:  Are clean dry and intact well-healed.  Assessment and plan: Patient followup in one month for wound check patient obtained normal diet

## 2012-01-02 ENCOUNTER — Encounter (INDEPENDENT_AMBULATORY_CARE_PROVIDER_SITE_OTHER): Payer: BC Managed Care – PPO | Admitting: General Surgery

## 2012-02-11 ENCOUNTER — Ambulatory Visit (INDEPENDENT_AMBULATORY_CARE_PROVIDER_SITE_OTHER): Payer: BC Managed Care – PPO | Admitting: Family Medicine

## 2012-02-11 ENCOUNTER — Encounter: Payer: Self-pay | Admitting: Family Medicine

## 2012-02-11 VITALS — BP 98/60 | HR 65 | Temp 98.3°F | Wt 117.0 lb

## 2012-02-11 DIAGNOSIS — B009 Herpesviral infection, unspecified: Secondary | ICD-10-CM

## 2012-02-11 DIAGNOSIS — F341 Dysthymic disorder: Secondary | ICD-10-CM

## 2012-02-11 DIAGNOSIS — D649 Anemia, unspecified: Secondary | ICD-10-CM

## 2012-02-11 DIAGNOSIS — F419 Anxiety disorder, unspecified: Secondary | ICD-10-CM

## 2012-02-11 DIAGNOSIS — R5381 Other malaise: Secondary | ICD-10-CM

## 2012-02-11 DIAGNOSIS — R531 Weakness: Secondary | ICD-10-CM

## 2012-02-11 DIAGNOSIS — Z23 Encounter for immunization: Secondary | ICD-10-CM

## 2012-02-11 DIAGNOSIS — B001 Herpesviral vesicular dermatitis: Secondary | ICD-10-CM

## 2012-02-11 LAB — POCT URINALYSIS DIPSTICK
Bilirubin, UA: NEGATIVE
Ketones, UA: NEGATIVE
Nitrite, UA: NEGATIVE
pH, UA: 7.5

## 2012-02-11 MED ORDER — VALACYCLOVIR HCL 500 MG PO TABS: 500.0000 mg | ORAL_TABLET | Freq: Two times a day (BID) | ORAL | Status: DC | PRN

## 2012-02-11 MED ORDER — ESCITALOPRAM OXALATE 10 MG PO TABS: 10.0000 mg | ORAL_TABLET | Freq: Every day | ORAL | Status: DC

## 2012-02-11 NOTE — Progress Notes (Signed)
  Subjective:    Patient ID: Janet Myers, female    DOB: [birth year], 37 y.o.   MRN: [id number]  HPIHere for several issues. First she has felt extremely tired for the past 6 months or so, and she often feels lightheaded especially if she gets up too fast. No vertigo or HA. No SOB. Her menses are regular and not particularly heavy. She has a hx of anemia, but her Hgb was stable last year. She takes no medications. Also she has frequent fever blisters around the mouth, and OTC agents do not help. Lastly she feels anxious all the time, worries about things, can't sleep well, and gets irritable with her family. She has tried a short course of Xanax and she felt great on this, but she is concerned about possible addiction to meds.    Review of Systems  Constitutional: Positive for fatigue. Negative for fever, activity change, appetite change and unexpected weight change.  HENT: Negative.   Respiratory: Negative.   Cardiovascular: Negative.   Gastrointestinal: Negative.   Genitourinary: Negative.   Neurological: Positive for light-headedness. Negative for dizziness and headaches.  Psychiatric/Behavioral: Positive for dysphoric mood and decreased concentration. Negative for hallucinations, confusion and agitation. The patient is nervous/anxious.        Objective:   Physical Exam  Constitutional: She is oriented to person, place, and time. She appears well-developed and well-nourished. No distress.  HENT:  Head: Normocephalic and atraumatic.  Right Ear: External ear normal.  Left Ear: External ear normal.  Nose: Nose normal.  Mouth/Throat: Oropharynx is clear and moist.  Eyes: Conjunctivae normal are normal. Pupils are equal, round, and reactive to light.  Neck: Neck supple. No thyromegaly present.  Cardiovascular: Normal rate, regular rhythm, normal heart sounds and intact distal pulses.   Pulmonary/Chest: Effort normal and breath sounds normal.  Lymphadenopathy:    She has no cervical  adenopathy.  Neurological: She is alert and oriented to person, place, and time.  Psychiatric: She has a normal mood and affect. Her behavior is normal. Thought content normal.          Assessment & Plan:  We will get some labs today to work up the fatigue and lightheadedness. Anemia, thyroid issues, and other problems could explain her symptoms. Try Valtrex for the fever blisters. Try Lexapro for the anxiety and recheck in 2-3 weeks

## 2012-02-12 ENCOUNTER — Ambulatory Visit: Payer: BC Managed Care – PPO | Admitting: Internal Medicine

## 2012-02-12 LAB — BASIC METABOLIC PANEL
Chloride: 102 mEq/L (ref 96–112)
GFR: 164.1 mL/min (ref >60.00)
Glucose, Bld: 89 mg/dL (ref 70–99)
Potassium: 3.7 mEq/L (ref 3.5–5.1)
Sodium: 137 mEq/L (ref 135–145)

## 2012-02-12 LAB — CBC WITH DIFFERENTIAL/PLATELET
Basophils Relative: 0.6 % (ref 0.0–3.0)
Eosinophils Absolute: 0.1 10*3/uL (ref 0.0–0.7)
Hemoglobin: 12.8 g/dL (ref 12.0–15.0)
Lymphs Abs: 3 10*3/uL (ref 0.7–4.0)
MCHC: 33.5 g/dL (ref 30.0–36.0)
MCV: 85.6 fl (ref 78.0–100.0)
Monocytes Absolute: 0.3 10*3/uL (ref 0.1–1.0)
Neutro Abs: 2.2 10*3/uL (ref 1.4–7.7)
RBC: 4.45 Mil/uL (ref 3.87–5.11)

## 2012-02-12 LAB — HEPATIC FUNCTION PANEL
ALT: 17 U/L (ref 0–35)
AST: 24 U/L (ref 0–37)
Albumin: 4.5 g/dL (ref 3.5–5.2)
Alkaline Phosphatase: 37 U/L — ABNORMAL LOW (ref 39–117)

## 2012-02-16 ENCOUNTER — Ambulatory Visit (INDEPENDENT_AMBULATORY_CARE_PROVIDER_SITE_OTHER): Payer: BC Managed Care – PPO | Admitting: Family Medicine

## 2012-02-16 DIAGNOSIS — E539 Vitamin B deficiency, unspecified: Secondary | ICD-10-CM

## 2012-02-16 MED ORDER — CYANOCOBALAMIN 1000 MCG/ML IJ SOLN
1000.0000 ug | Freq: Once | INTRAMUSCULAR | Status: AC
  Administered 2012-02-16: 1000 ug via INTRAMUSCULAR

## 2012-02-16 NOTE — Progress Notes (Signed)
Quick Note:  I left voice message for pt to return my call. ______ 

## 2012-02-16 NOTE — Progress Notes (Signed)
Quick Note:  I spoke with pt and she is going to schedule the injections. ______

## 2012-02-18 ENCOUNTER — Ambulatory Visit: Payer: BC Managed Care – PPO | Admitting: Internal Medicine

## 2012-02-23 ENCOUNTER — Ambulatory Visit (INDEPENDENT_AMBULATORY_CARE_PROVIDER_SITE_OTHER): Payer: BC Managed Care – PPO | Admitting: Family Medicine

## 2012-02-23 DIAGNOSIS — E539 Vitamin B deficiency, unspecified: Secondary | ICD-10-CM

## 2012-02-23 MED ORDER — CYANOCOBALAMIN 1000 MCG/ML IJ SOLN
1000.0000 ug | Freq: Once | INTRAMUSCULAR | Status: AC
  Administered 2012-02-23: 1000 ug via INTRAMUSCULAR

## 2012-02-24 ENCOUNTER — Telehealth: Payer: Self-pay | Admitting: Family Medicine

## 2012-02-24 NOTE — Telephone Encounter (Signed)
Yes call them in

## 2012-02-24 NOTE — Telephone Encounter (Signed)
Pt can get the Vitamin B from [facility] and she would prefer to get the script and then get the injections at work. Can I call in for her?

## 2012-02-25 MED ORDER — NEEDLES & SYRINGES MISC: 1.0000 mL | Status: DC

## 2012-02-25 MED ORDER — CYANOCOBALAMIN 1000 MCG/ML IJ SOLN: 1000.0000 ug | INTRAMUSCULAR | Status: DC

## 2012-02-25 NOTE — Telephone Encounter (Signed)
I sent script to pharmacy.

## 2012-02-26 ENCOUNTER — Ambulatory Visit (INDEPENDENT_AMBULATORY_CARE_PROVIDER_SITE_OTHER): Payer: BC Managed Care – PPO | Admitting: Family Medicine

## 2012-02-26 ENCOUNTER — Encounter: Payer: Self-pay | Admitting: Family Medicine

## 2012-02-26 VITALS — BP 98/60 | HR 91 | Temp 98.2°F | Wt 114.0 lb

## 2012-02-26 DIAGNOSIS — J029 Acute pharyngitis, unspecified: Secondary | ICD-10-CM

## 2012-02-26 MED ORDER — METHYLPREDNISOLONE 4 MG PO KIT: PACK | ORAL | Status: AC

## 2012-02-26 MED ORDER — CEPHALEXIN 500 MG PO CAPS: 500.0000 mg | ORAL_CAPSULE | Freq: Three times a day (TID) | ORAL | Status: AC

## 2012-02-26 NOTE — Progress Notes (Signed)
  Subjective:    Patient ID: RENNE Myers, female    DOB: 1975/10/16, 37 y.o.   MRN: [id number]  HPI Here for the sudden onset yesterday of fever to 101.5 degrees, body aches, HA, and a bad ST. No NVD. No coughing. Today the aches and fever are better but she still has the ST. Taking Ibuprofen.    Review of Systems  Constitutional: Positive for fever and chills.  HENT: Positive for sore throat. Negative for ear pain, congestion, neck pain, neck stiffness, postnasal drip and sinus pressure.   Eyes: Negative.   Respiratory: Negative.        Objective:   Physical Exam  Constitutional: She appears well-developed and well-nourished.  HENT:  Right Ear: External ear normal.  Left Ear: External ear normal.  Nose: Nose normal.       Posterior OP is red without exudate   Eyes: Conjunctivae normal are normal.  Neck: Neck supple. No thyromegaly present.       Tender shotty AC nodes bilaterally   Pulmonary/Chest: Effort normal and breath sounds normal.          Assessment & Plan:  Out of work today and tomorrow

## 2012-02-28 ENCOUNTER — Telehealth: Payer: Self-pay | Admitting: Internal Medicine

## 2012-02-28 ENCOUNTER — Encounter: Payer: Self-pay | Admitting: Family Medicine

## 2012-02-28 ENCOUNTER — Ambulatory Visit (INDEPENDENT_AMBULATORY_CARE_PROVIDER_SITE_OTHER): Payer: BC Managed Care – PPO | Admitting: Family Medicine

## 2012-02-28 VITALS — BP 102/60 | HR 59 | Temp 98.8°F | Wt 112.0 lb

## 2012-02-28 DIAGNOSIS — B349 Viral infection, unspecified: Secondary | ICD-10-CM

## 2012-02-28 DIAGNOSIS — B9789 Other viral agents as the cause of diseases classified elsewhere: Secondary | ICD-10-CM

## 2012-02-28 NOTE — Telephone Encounter (Signed)
Patient Information:  Caller Name: Mithra  Phone: [phone]  Patient: Janet Myers, Janet Myers  Gender: Female  DOB: 10-Mar-1975  Age: 37 Years  PCP: Nicki Reaper  Pregnant: No  Office Follow Up:  Does the office need to follow up with this patient?: No  Instructions For The Office: N/A  RN Note:  Diagnosed with strep; onset of blister-like rash on hands and feet.  Son seen in his doctor's office for possible HFM but told it was not HFM.  Per rash protocol, advised appt immediately; appt scheduled 1145 02/28/12 in office.  Symptoms  Reason For Call & Symptoms: blisters on hands; strep throat; seen in office this week  Reviewed Health History In EMR: Yes  Reviewed Medications In EMR: Yes  Reviewed Allergies In EMR: Yes  Reviewed Surgeries / Procedures: Yes  Date of Onset of Symptoms: 02/25/2012 OB / GYN:  LMP: Unknown  Guideline(s) Used:  Rash or Redness - Widespread  Disposition Per Guideline:   Go to Office Now  Reason For Disposition Reached:   Rash looks like large or small blisters (i.e., fluid filled bubbles or sacs on the skin)  Advice Given:  N/A  Appointment Scheduled:  02/28/2012 11:45:00 Appointment Scheduled Provider:  N/A

## 2012-02-28 NOTE — Progress Notes (Signed)
Chief Complaint  Patient presents with  . Rash    ? hand, foot, mouth. Recent strep and exposure to mono.     HPI:  Urgent Care Visit for rash: -40 month old niece had hand foot and mouth disease -her children have had viral pharyngitis and rashes recently -patient's symptoms started: 3-4 days ago -saw her doctor 3 days ago and treated with cephalexin for strep - but strep test not done - because her throat had red spots and "pus pockets" -symptoms: sore throat, body aches, chills, fever 101.5 a few days ago, headaches - now these symptoms all resolved, but has ulcers in mouth and rash on hand started last night - itchy  ROS: See pertinent positives and negatives per HPI.  Past Medical History  Diagnosis Date  . Headache   . Anxiety     father recently passed away  . Gallstones     Family History  Problem Relation Age of Onset  . Depression Mother   . Hepatitis Mother     c  . Osteopenia Mother   . Alcohol abuse Father   . COPD Father   . Heart disease Father   . Cirrhosis Father   . Hepatitis Father     c  . Osteopenia Father     History   Social History  . Marital Status: Married    Spouse Name: N/A    Number of Children: N/A  . Years of Education: N/A   Social History Main Topics  . Smoking status: Never Smoker   . Smokeless tobacco: Never Used  . Alcohol Use: Yes     Comment: rare  . Drug Use: No  . Sexually Active: None   Other Topics Concern  . None   Social History Narrative  . None    Current outpatient prescriptions:cephALEXin (KEFLEX) 500 MG capsule, Take 1 capsule (500 mg total) by mouth 3 (three) times daily., Disp: 30 capsule, Rfl: 0;  cyanocobalamin (,VITAMIN B-12,) 1000 MCG/ML injection, Inject 1 mL (1,000 mcg total) into the muscle once a week., Disp: 10 mL, Rfl: 0;  escitalopram (LEXAPRO) 10 MG tablet, Take 1 tablet (10 mg total) by mouth daily., Disp: 30 tablet, Rfl: 5 ibuprofen (ADVIL,MOTRIN) 200 MG tablet, Take 600-800 mg by mouth 2  (two) times daily as needed. For pain/headache, Disp: , Rfl: ;  Needles & Syringes MISC, 1 mL by Does not apply route once a week., Disp: 10 each, Rfl: 0;  ondansetron (ZOFRAN ODT) 8 MG disintegrating tablet, Take 1 tablet (8 mg total) by mouth every 8 (eight) hours as needed for nausea., Disp: 20 tablet, Rfl: 0 oxyCODONE-acetaminophen (PERCOCET/ROXICET) 5-325 MG per tablet, Take 2 tablets by mouth every 4 (four) hours as needed for pain., Disp: 20 tablet, Rfl: 0;  valACYclovir (VALTREX) 500 MG tablet, Take 1 tablet (500 mg total) by mouth 2 (two) times daily as needed (fever blisters )., Disp: 60 tablet, Rfl: 5;  methylPREDNISolone (MEDROL DOSEPAK) 4 MG tablet, follow package directions, Disp: 21 tablet, Rfl: 0 [DISCONTINUED] JUNEL FE 1/20 1-20 MG-MCG tablet, Take 1 tablet by mouth daily. Loestrin, Disp: , Rfl: ;  [DISCONTINUED] verapamil (CALAN) 120 MG tablet, Take 120 mg by mouth 3 (three) times daily. , Disp: , Rfl:   EXAM:  Filed Vitals:   02/28/12 1156  BP: 102/60  Pulse: 59  Temp: 98.8 F (37.1 C)    There is no height on file to calculate BMI.  GENERAL: vitals reviewed and listed above, alert, oriented, appears  well hydrated and in no acute distress  HEENT: atraumatic, conjunttiva clear, no obvious abnormalities on inspection of external nose and ears, 2 small ulcers in mouth and erythema of soft palate, clear nasal congestion, PND  NECK: no obvious masses on inspection  LUNGS: clear to auscultation bilaterally, no wheezes, rales or rhonchi, good air movement  CV: HRRR, no peripheral edema  SKIN: 2 small red papules on R hand  MS: moves all extremities without noticeable abnormality  PSYCH: pleasant and cooperative, no obvious depression or anxiety  ASSESSMENT AND PLAN:  Discussed the following assessment and plan:  1. Viral syndrome    -? Entero or perechovirus, symptoms improving and patient appears well -supportive care and return precautions, discussed  transmission -Patient advised to return or notify a doctor immediately if symptoms worsen or persist or new concerns arise.  Patient Instructions  -this is likely a viral illness  -if you start getting fevers again or feeling sick again or this rash spreads - see your doctor       Terressa Koyanagi.

## 2012-02-28 NOTE — Patient Instructions (Addendum)
-  this is likely a viral illness  -if you start getting fevers again or feeling sick again or this rash spreads - see your doctor

## 2012-04-26 ENCOUNTER — Telehealth: Payer: Self-pay | Admitting: Internal Medicine

## 2012-04-26 ENCOUNTER — Encounter: Payer: Self-pay | Admitting: Family Medicine

## 2012-04-26 ENCOUNTER — Ambulatory Visit (INDEPENDENT_AMBULATORY_CARE_PROVIDER_SITE_OTHER): Payer: BC Managed Care – PPO | Admitting: Family Medicine

## 2012-04-26 VITALS — BP 98/70 | HR 84 | Temp 98.5°F | Wt 115.0 lb

## 2012-04-26 DIAGNOSIS — D649 Anemia, unspecified: Secondary | ICD-10-CM

## 2012-04-26 DIAGNOSIS — E539 Vitamin B deficiency, unspecified: Secondary | ICD-10-CM

## 2012-04-26 DIAGNOSIS — R5381 Other malaise: Secondary | ICD-10-CM

## 2012-04-26 DIAGNOSIS — R531 Weakness: Secondary | ICD-10-CM

## 2012-04-26 LAB — CBC WITH DIFFERENTIAL/PLATELET
Basophils Absolute: 0 10*3/uL (ref 0.0–0.1)
Eosinophils Absolute: 0.1 10*3/uL (ref 0.0–0.7)
Hemoglobin: 13.2 g/dL (ref 12.0–15.0)
Lymphocytes Relative: 44.9 % (ref 12.0–46.0)
MCHC: 33.5 g/dL (ref 30.0–36.0)
Neutro Abs: 2.3 10*3/uL (ref 1.4–7.7)
Neutrophils Relative %: 45.8 % (ref 43.0–77.0)
RDW: 14.3 % (ref 11.5–14.6)

## 2012-04-26 LAB — BASIC METABOLIC PANEL
BUN: 11 mg/dL (ref 6–23)
Calcium: 9.2 mg/dL (ref 8.4–10.5)
GFR: 139.19 mL/min (ref >60.00)
Glucose, Bld: 91 mg/dL (ref 70–99)

## 2012-04-26 LAB — IRON: Iron: 58 ug/dL (ref 42–145)

## 2012-04-26 NOTE — Telephone Encounter (Signed)
[facility] Triage Call Report Triage Record Num: [id number] Operator: Candida Peeling Patient Name: Janet Myers Call Date & Time: 04/24/2012 6:11:37PM Patient Phone: [phone] PCP: Gordy Savers Patient Gender: Female PCP Fax : [phone] Patient DOB: 03-25-1975 [facility] Name[facility] Reason for Call: Caller: [name]/Patient; PCP: Eleonore Chiquito (Family Practice); CB#: [phone]; Call regarding Dizziness and fatigue; Onset 04/24/12 having more frequent dizziness and fatigue. 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 BP on 04/23/12 was 101/80 and was feeling very dizzy. Fatigue has improved some w/ Vit B 12 but still having difficulty getting out of bed to go to work. Having dizziness at present. Has had intermittent sharp shooting pains above or under left breast, but none at present. LMP 03/31/12. Dizziness occurred at [facility] but driving home seem to lessen dizziness. Driving usually makes dizziness better. Also noting feeling of being hot. All emergent sxs r/o per Dizziness or Vertigo protocol. Care advice given. Per nursing judgement, RN instructed to contact office on Mon, 04/26/12. Protocol(s) Used: Dizziness or Vertigo Recommended Outcome per Protocol: See Provider within 2 Weeks Override Outcome if Used in Protocol: Call Provider When Office is Open RN Reason for Override Outcome: Nursing Judgement Used. Reason for Outcome: All other situations Care Advice: ~ DO NOT drive until condition evaluated. Avoid caffeine (coffee, tea, cola drinks, or chocolate), alcohol, and nicotine (use of tobacco), as use of these substances may worsen symptoms. ~ Change position slowly. Sit for a couple of minutes before standing to walk. Quick position changes may cause or worsen symptoms. ~ ~ Eat 4-5 regular, well balanced, small meals each day. Eat breakfast within one to two hours of waking. ~ Lie still in a dimly lit room and avoid any sudden change in  position. Total water intake includes drinking water, water in beverages, and water contained in food. Fluids make up about 80% of the body's total hydration need. Individual fluid requirement to maintain hydration vary based on physical activity, environmental factors and illness. Limit fluids that contain sugar, caffeine, or alcohol. Urine will be very light yellow color when you drink enough fluids. ~ 04/24/2012 6:34:08PM Page 1 of 1 [id number]

## 2012-04-26 NOTE — Progress Notes (Signed)
  Subjective:    Patient ID: Janet Myers, female    DOB: [birth year], 37 y.o.   MRN: [id number]  HPI Here for 2 weeks of fatigue, dizziness, and headaches. She was found to have a low B12 in January and she has been getting B12 shots weekly since then. Her headaches are frontal and they are mild. No vision changes. No nausea. She started her menses 2 days ago.    Review of Systems  Constitutional: Positive for fatigue. Negative for fever.  Eyes: Negative.   Respiratory: Negative.   Cardiovascular: Negative.   Neurological: Positive for light-headedness and headaches. Negative for dizziness, tremors, seizures, syncope, speech difficulty, weakness and numbness.       Objective:   Physical Exam  Constitutional: She is oriented to person, place, and time. She appears well-developed and well-nourished.  HENT:  Right Ear: External ear normal.  Left Ear: External ear normal.  Nose: Nose normal.  Mouth/Throat: Oropharynx is clear and moist.  Eyes: Conjunctivae and EOM are normal. Pupils are equal, round, and reactive to light.  Neck: No thyromegaly present.  Cardiovascular: Normal rate, regular rhythm, normal heart sounds and intact distal pulses.   Pulmonary/Chest: Effort normal and breath sounds normal.  Lymphadenopathy:    She has no cervical adenopathy.  Neurological: She is alert and oriented to person, place, and time. She has normal reflexes. No cranial nerve deficit. She exhibits normal muscle tone.          Assessment & Plan:  Fatigue of uncertain etiology. Get labs today

## 2012-04-27 MED ORDER — CYANOCOBALAMIN 1000 MCG/ML IJ SOLN: 1000.0000 ug | INTRAMUSCULAR | Status: DC

## 2012-04-27 MED ORDER — NEEDLES & SYRINGES MISC: 1.0000 mL | Status: DC

## 2012-04-27 MED ORDER — FERROUS SULFATE 325 (65 FE) MG PO TABS: 325.0000 mg | ORAL_TABLET | Freq: Two times a day (BID) | ORAL | Status: DC

## 2012-04-27 NOTE — Addendum Note (Signed)
Addended by: Aniceto Boss A on: 04/27/2012 12:49 PM   Modules accepted: Orders

## 2012-04-27 NOTE — Progress Notes (Signed)
Quick Note:  I sent scripts e-scribe and also put future lab order in for Vit. B12 ______

## 2012-04-27 NOTE — Progress Notes (Signed)
Quick Note:  Per Dr. Clent Ridges, pt should continue with Vit B 12 injections once per month for 6 months and then recheck level. I spoke with pt and gave results and I just need to send in scripts to [facility]. ______

## 2012-05-03 ENCOUNTER — Ambulatory Visit: Payer: BC Managed Care – PPO | Admitting: Family Medicine

## 2012-05-20 ENCOUNTER — Telehealth: Payer: Self-pay | Admitting: Internal Medicine

## 2012-05-20 ENCOUNTER — Ambulatory Visit (INDEPENDENT_AMBULATORY_CARE_PROVIDER_SITE_OTHER): Payer: BC Managed Care – PPO | Admitting: Internal Medicine

## 2012-05-20 ENCOUNTER — Encounter: Payer: Self-pay | Admitting: Internal Medicine

## 2012-05-20 VITALS — BP 90/50 | HR 68 | Temp 98.6°F | Resp 18 | Wt 119.0 lb

## 2012-05-20 DIAGNOSIS — K219 Gastro-esophageal reflux disease without esophagitis: Secondary | ICD-10-CM

## 2012-05-20 NOTE — Patient Instructions (Signed)
Avoids foods high in acid such as tomatoes citrus juices, and spicy foods.  Avoid eating within two hours of lying down or before exercising.  Do not overheat.  Try smaller more frequent meals.  If symptoms persist, elevate the head of her bed 12 inches while sleeping.   PRILOSEC (Omeprazole)  One daily  Diet for Gastroesophageal Reflux Disease, Adult Reflux (acid reflux) is when acid from your stomach flows up into the esophagus. When acid comes in contact with the esophagus, the acid causes irritation and soreness (inflammation) in the esophagus. When reflux happens often or so severely that it causes damage to the esophagus, it is called gastroesophageal reflux disease (GERD). Nutrition therapy can help ease the discomfort of GERD. FOODS OR DRINKS TO AVOID OR LIMIT  Smoking or chewing tobacco. Nicotine is one of the most potent stimulants to acid production in the gastrointestinal tract.  Caffeinated and decaffeinated coffee and black tea.  Regular or low-calorie carbonated beverages or energy drinks (caffeine-free carbonated beverages are allowed).   Strong spices, such as black pepper, white pepper, red pepper, cayenne, curry powder, and chili powder.  Peppermint or spearmint.  Chocolate.  High-fat foods, including meats and fried foods. Extra added fats including oils, butter, salad dressings, and nuts. Limit these to less than 8 tsp per day.  Fruits and vegetables if they are not tolerated, such as citrus fruits or tomatoes.  Alcohol.  Any food that seems to aggravate your condition. If you have questions regarding your diet, call your caregiver or a registered dietitian. OTHER THINGS THAT MAY HELP GERD INCLUDE:   Eating your meals slowly, in a relaxed setting.  Eating 5 to 6 small meals per day instead of 3 large meals.  Eliminating food for a period of time if it causes distress.  Not lying down until 3 hours after eating a meal.  Keeping the head of your bed raised 6  to 9 inches (15 to 23 cm) by using a foam wedge or blocks under the legs of the bed. Lying flat may make symptoms worse.  Being physically active. Weight loss may be helpful in reducing reflux in overweight or obese adults.  Wear loose fitting clothing EXAMPLE MEAL PLAN This meal plan is approximately 2,000 calories based on https://www.bernard.org/ meal planning guidelines. Breakfast   cup cooked oatmeal.  1 cup strawberries.  1 cup low-fat milk.  1 oz almonds. Snack  1 cup cucumber slices.  6 oz yogurt (made from low-fat or fat-free milk). Lunch  2 slice whole-wheat bread.  2 oz sliced [place].  2 tsp mayonnaise.  1 cup blueberries.  1 cup snap peas. Snack  6 whole-wheat crackers.  1 oz string cheese. Dinner   cup brown rice.  1 cup mixed veggies.  1 tsp olive oil.  3 oz grilled fish. Document Released: 01/13/2005 Document Revised: 04/07/2011 Document Reviewed: 11/29/2010 [facility] Patient Information 2013 [place], [place]. Gastroesophageal Reflux Disease, Adult Gastroesophageal reflux disease (GERD) happens when acid from your stomach flows up into the esophagus. When acid comes in contact with the esophagus, the acid causes soreness (inflammation) in the esophagus. Over time, GERD may create small holes (ulcers) in the lining of the esophagus. CAUSES   Increased body weight. This puts pressure on the stomach, making acid rise from the stomach into the esophagus.  Smoking. This increases acid production in the stomach.  Drinking alcohol. This causes decreased pressure in the lower esophageal sphincter (valve or ring of muscle between the esophagus and stomach), allowing  acid from the stomach into the esophagus.  Late evening meals and a full stomach. This increases pressure and acid production in the stomach.  A malformed lower esophageal sphincter. Sometimes, no cause is found. SYMPTOMS   Burning pain in the lower part of the mid-chest behind the  breastbone and in the mid-stomach area. This may occur twice a week or more often.  Trouble swallowing.  Sore throat.  Dry cough.  Asthma-like symptoms including chest tightness, shortness of breath, or wheezing. DIAGNOSIS  Your caregiver may be able to diagnose GERD based on your symptoms. In some cases, X-rays and other tests may be done to check for complications or to check the condition of your stomach and esophagus. TREATMENT  Your caregiver may recommend over-the-counter or prescription medicines to help decrease acid production. Ask your caregiver before starting or adding any new medicines.  HOME CARE INSTRUCTIONS   Change the factors that you can control. Ask your caregiver for guidance concerning weight loss, quitting smoking, and alcohol consumption.  Avoid foods and drinks that make your symptoms worse, such as:  Caffeine or alcoholic drinks.  Chocolate.  Peppermint or mint flavorings.  Garlic and onions.  Spicy foods.  Citrus fruits, such as oranges, lemons, or limes.  Tomato-based foods such as sauce, chili, salsa, and pizza.  Fried and fatty foods.  Avoid lying down for the 3 hours prior to your bedtime or prior to taking a nap.  Eat small, frequent meals instead of large meals.  Wear loose-fitting clothing. Do not wear anything tight around your waist that causes pressure on your stomach.  Raise the head of your bed 6 to 8 inches with wood blocks to help you sleep. Extra pillows will not help.  Only take over-the-counter or prescription medicines for pain, discomfort, or fever as directed by your caregiver.  Do not take aspirin, ibuprofen, or other nonsteroidal anti-inflammatory drugs (NSAIDs). SEEK IMMEDIATE MEDICAL CARE IF:   You have pain in your arms, neck, jaw, teeth, or back.  Your pain increases or changes in intensity or duration.  You develop nausea, vomiting, or sweating (diaphoresis).  You develop shortness of breath, or you  faint.  Your vomit is green, yellow, black, or looks like coffee grounds or blood.  Your stool is red, bloody, or black. These symptoms could be signs of other problems, such as heart disease, gastric bleeding, or esophageal bleeding. MAKE SURE YOU:   Understand these instructions.  Will watch your condition.  Will get help right away if you are not doing well or get worse. Document Released: 10/23/2004 Document Revised: 04/07/2011 Document Reviewed: 08/02/2010 [facility] Patient Information 2013 [place], [place].

## 2012-05-20 NOTE — Telephone Encounter (Signed)
Patient Information:  Caller Name: Giulietta  Phone: [phone]  Patient: Janet Myers, Janet Myers  Gender: Female  DOB: February 25, 1975  Age: 37 Years  PCP: Eleonore Chiquito [facility])  Pregnant: No  Office Follow Up:  Does the office need to follow up with this patient?: No  Instructions For The Office: N/A  RN Note:  Pain last night 9/10 on pain scale in upper abdomen near sternum, up underneath breast to chest center moving to back.  It would briefly stop and continue pattern lasting about  1.5 hours.  During the worse pain felt she was going to faint.  Pain very similar to pain prior to gallbladder surgery.  Continues this morning in back and chest but much less severve-4/10 pain scale.  Has not had intake this morning.  Is urinating regularly.  Care advice given.  Has appointment 04/24 with Dr. Lesia Hausen.  Symptoms  Reason For Call & Symptoms: Abdominal pain last night -very severe.  Continued until this morning.  Took (2) of percocet at HS with pain starting 2 hours later.  Medication was related to a gum graft.  Is also taking Amoxicillin   Gum graft.  Reviewed Health History In EMR: Yes  Reviewed Medications In EMR: Yes  Reviewed Allergies In EMR: Yes  Reviewed Surgeries / Procedures: Yes  Date of Onset of Symptoms: 05/19/2012 OB / GYN:  LMP: 05/20/2012  Guideline(s) Used:  Abdominal Pain - Female  Disposition Per Guideline:   See Today in Office  Reason For Disposition Reached:   Patient wants to be seen  Advice Given:  Rest:  Lie down and rest until you feel better.  Fluids:  Sip clear fluids only (e.g., water, flat soft drinks or 1/2 strength fruit juice) until the pain has been gone for over 2 hours. Then slowly return to a regular diet.  Fluids:  Sip clear fluids only (e.g., water, flat soft drinks or 1/2 strength fruit juice) until the pain has been gone for over 2 hours. Then slowly return to a regular diet.  Diet:  Slowly advance diet from clear liquids  to a bland diet  Avoid alcohol or caffeinated beverages  Avoid greasy or fatty foods.  Pass a BM:  Sit on the toilet and try to pass a bowel movement (BM). Do not strain. This may relieve the pain if it is due to constipation or impending diarrhea.  Expected Course:  With harmless causes, the pain is usually better or goes away within 2 hours. With viral gastroenteritis ("stomach flu"), belly cramps may precede each bout of vomiting or diarrhea and may last 2-3 days. With serious causes (such as appendicitis) the pain becomes constant and more severe.  RN Overrode Recommendation:  Follow Up With Office Later  Has an appointment 04/24 with her PCP in office.

## 2012-05-21 ENCOUNTER — Encounter: Payer: Self-pay | Admitting: Internal Medicine

## 2012-05-21 NOTE — Progress Notes (Signed)
Subjective:    Patient ID: Janet Myers, female    DOB: 12-Oct-1975, 37 y.o.   MRN: [id number]  HPI  37 year old patient who is seen today for evaluation of epigastric and substernal burning pain. She had similar symptoms in October and evaluation included an abdominal ultrasound that revealed multiple gallstones. She is now status post cholecystectomy. She has been symptom-free until the past few days symptoms radiate to the back and associated with slight nausea. She states these symptoms are identical to her symptoms in October of last year. No history of chronic GERD but did have significant reflux while pregnant. Past Medical History  Diagnosis Date  . Headache   . Anxiety     father recently passed away  . Gallstones     History   Social History  . Marital Status: Married    Spouse Name: N/A    Number of Children: N/A  . Years of Education: N/A   Occupational History  . Not on file.   Social History Main Topics  . Smoking status: Never Smoker   . Smokeless tobacco: Never Used  . Alcohol Use: Yes     Comment: rare  . Drug Use: No  . Sexually Active: Not on file   Other Topics Concern  . Not on file   Social History Narrative  . No narrative on file    Past Surgical History  Procedure Laterality Date  . Eye surgery    . Cesarean section    . Laparoscopic tubal ligation  04/21/2011    Procedure: LAPAROSCOPIC TUBAL LIGATION;  Surgeon: Zelphia Cairo, MD;  Location: [facility];  Service: Gynecology;  Laterality: Bilateral;  Bilateral with Filshie Clips    Family History  Problem Relation Age of Onset  . Depression Mother   . Hepatitis Mother     c  . Osteopenia Mother   . Alcohol abuse Father   . COPD Father   . Heart disease Father   . Cirrhosis Father   . Hepatitis Father     c  . Osteopenia Father     Allergies  Allergen Reactions  . Codeine Nausea Only    Can take Percocet (not Vicodin)    Current Outpatient Prescriptions on File Prior to  Visit  Medication Sig Dispense Refill  . cyanocobalamin (,VITAMIN B-12,) 1000 MCG/ML injection Inject 1 mL (1,000 mcg total) into the muscle every 30 (thirty) days.  10 mL  0  . escitalopram (LEXAPRO) 10 MG tablet Take 1 tablet (10 mg total) by mouth daily.  30 tablet  5  . ferrous sulfate 325 (65 FE) MG tablet Take 1 tablet (325 mg total) by mouth 2 (two) times daily.  60 tablet  11  . ibuprofen (ADVIL,MOTRIN) 200 MG tablet Take 600-800 mg by mouth 2 (two) times daily as needed. For pain/headache      . Needles & Syringes MISC 1 mL by Does not apply route every 30 (thirty) days.  10 each  0  . valACYclovir (VALTREX) 500 MG tablet Take 1 tablet (500 mg total) by mouth 2 (two) times daily as needed (fever blisters ).  60 tablet  5  . [DISCONTINUED] JUNEL FE 1/20 1-20 MG-MCG tablet Take 1 tablet by mouth daily. Loestrin      . [DISCONTINUED] verapamil (CALAN) 120 MG tablet Take 120 mg by mouth 3 (three) times daily.        No current facility-administered medications on file prior to visit.    BP 90/50  Pulse 68  Temp(Src) 98.6 F (37 C) (Oral)  Resp 18  Wt 119 lb (53.978 kg)  BMI 22.5 kg/m2  SpO2 98%  LMP 05/01/2012         Review of Systems  Constitutional: Negative.   HENT: Negative for hearing loss, congestion, sore throat, rhinorrhea, dental problem, sinus pressure and tinnitus.   Eyes: Negative for pain, discharge and visual disturbance.  Respiratory: Negative for cough and shortness of breath.   Cardiovascular: Negative for chest pain, palpitations and leg swelling.  Gastrointestinal: Positive for nausea and abdominal pain. Negative for vomiting, diarrhea, constipation, blood in stool and abdominal distention.  Genitourinary: Negative for dysuria, urgency, frequency, hematuria, flank pain, vaginal bleeding, vaginal discharge, difficulty urinating, vaginal pain and pelvic pain.  Musculoskeletal: Negative for joint swelling, arthralgias and gait problem.  Skin: Negative for  rash.  Neurological: Negative for dizziness, syncope, speech difficulty, weakness, numbness and headaches.  Hematological: Negative for adenopathy.  Psychiatric/Behavioral: Negative for behavioral problems, dysphoric mood and agitation. The patient is not nervous/anxious.        Objective:   Physical Exam  Constitutional: She is oriented to person, place, and time. She appears well-developed and well-nourished.  HENT:  Head: Normocephalic.  Right Ear: External ear normal.  Left Ear: External ear normal.  Mouth/Throat: Oropharynx is clear and moist.  Eyes: Conjunctivae and EOM are normal. Pupils are equal, round, and reactive to light.  Neck: Normal range of motion. Neck supple. No thyromegaly present.  Cardiovascular: Normal rate, regular rhythm, normal heart sounds and intact distal pulses.   Pulmonary/Chest: Effort normal and breath sounds normal.  Abdominal: Soft. Bowel sounds are normal. She exhibits no mass. There is no tenderness.  Musculoskeletal: Normal range of motion.  Lymphadenopathy:    She has no cervical adenopathy.  Neurological: She is alert and oriented to person, place, and time.  Skin: Skin is warm and dry. No rash noted.  Psychiatric: She has a normal mood and affect. Her behavior is normal.          Assessment & Plan:  Epigastric and retrosternal burning pain. Suspect GERD. We'll treat with aggressive anti-reflex regimen as well his short-term Prilosec. She will call if unimproved Status post cholecystectomy

## 2012-07-19 ENCOUNTER — Telehealth: Payer: Self-pay | Admitting: Internal Medicine

## 2012-07-19 NOTE — Telephone Encounter (Signed)
appt made/pt aware/kh  

## 2012-07-19 NOTE — Telephone Encounter (Signed)
Pt called to asked if we had checked her blood sugar at her last labs. Pt has had some dizziness over the weekend. Her father checked her blood sugar, it was 158 and she had not eaten.  Pt would like to know if you want her to come in for  labs and then make a follow up appt,or just make an appt?  Pls advise.

## 2012-07-22 ENCOUNTER — Other Ambulatory Visit: Payer: Self-pay | Admitting: *Deleted

## 2012-07-22 ENCOUNTER — Ambulatory Visit (INDEPENDENT_AMBULATORY_CARE_PROVIDER_SITE_OTHER): Payer: BC Managed Care – PPO | Admitting: Internal Medicine

## 2012-07-22 ENCOUNTER — Encounter: Payer: Self-pay | Admitting: Internal Medicine

## 2012-07-22 VITALS — BP 100/70 | HR 77 | Temp 98.6°F | Resp 18 | Wt 115.0 lb

## 2012-07-22 DIAGNOSIS — E119 Type 2 diabetes mellitus without complications: Secondary | ICD-10-CM

## 2012-07-22 DIAGNOSIS — E538 Deficiency of other specified B group vitamins: Secondary | ICD-10-CM

## 2012-07-22 DIAGNOSIS — R42 Dizziness and giddiness: Secondary | ICD-10-CM

## 2012-07-22 DIAGNOSIS — D509 Iron deficiency anemia, unspecified: Secondary | ICD-10-CM | POA: Insufficient documentation

## 2012-07-22 LAB — CBC WITH DIFFERENTIAL/PLATELET
Basophils Relative: 0.7 % (ref 0.0–3.0)
Eosinophils Relative: 1.4 % (ref 0.0–5.0)
HCT: 41.4 % (ref 36.0–46.0)
Lymphs Abs: 2.2 10*3/uL (ref 0.7–4.0)
MCV: 89.9 fl (ref 78.0–100.0)
Monocytes Absolute: 0.4 10*3/uL (ref 0.1–1.0)
Neutro Abs: 1.6 10*3/uL (ref 1.4–7.7)
Platelets: 272 10*3/uL (ref 150.0–400.0)
WBC: 4.3 10*3/uL — ABNORMAL LOW (ref 4.5–10.5)

## 2012-07-22 LAB — TSH: TSH: 1.34 u[IU]/mL (ref 0.35–5.50)

## 2012-07-22 MED ORDER — SYRINGE (DISPOSABLE) 3 ML MISC: 1.0000 | Status: DC

## 2012-07-22 MED ORDER — "NEEDLE (DISP) 25G X 1"" MISC": 1.0000 | Status: DC

## 2012-07-22 MED ORDER — CYANOCOBALAMIN 1000 MCG/ML IJ SOLN: 1000.0000 ug | INTRAMUSCULAR | Status: DC

## 2012-07-22 NOTE — Patient Instructions (Signed)
It is important that you exercise regularly, at least 20 minutes 3 to 4 times per week.  If you develop chest pain or shortness of breath seek  medical attention.  Return in 6 months for follow-up  

## 2012-07-22 NOTE — Progress Notes (Signed)
Subjective:    Patient ID: Janet Myers, female    DOB: 03-Sep-1975, 37 y.o.   MRN: [id number]  HPI  37 year old patient who is seen today for followup. She has chronic dizziness which is unchanged. Her main concern today is a possible elevated blood sugar. Random blood sugar has been as high as 158 fasting blood sugars are sometimes over 100. Fasting blood sugar here today 93 She also has been diagnosed with B12 deficiency and has been receiving monthly B12 injections  Past Medical History  Diagnosis Date  . Headache(784.0)   . Anxiety     father recently passed away  . Gallstones     History   Social History  . Marital Status: Married    Spouse Name: N/A    Number of Children: N/A  . Years of Education: N/A   Occupational History  . Not on file.   Social History Main Topics  . Smoking status: Never Smoker   . Smokeless tobacco: Never Used  . Alcohol Use: Yes     Comment: rare  . Drug Use: No  . Sexually Active: Not on file   Other Topics Concern  . Not on file   Social History Narrative  . No narrative on file    Past Surgical History  Procedure Laterality Date  . Eye surgery    . Cesarean section    . Laparoscopic tubal ligation  04/21/2011    Procedure: LAPAROSCOPIC TUBAL LIGATION;  Surgeon: Zelphia Cairo, MD;  Location: [facility];  Service: Gynecology;  Laterality: Bilateral;  Bilateral with Filshie Clips    Family History  Problem Relation Age of Onset  . Depression Mother   . Hepatitis Mother     c  . Osteopenia Mother   . Alcohol abuse Father   . COPD Father   . Heart disease Father   . Cirrhosis Father   . Hepatitis Father     c  . Osteopenia Father     Allergies  Allergen Reactions  . Codeine Nausea Only    Can take Percocet (not Vicodin)    Current Outpatient Prescriptions on File Prior to Visit  Medication Sig Dispense Refill  . cyanocobalamin (,VITAMIN B-12,) 1000 MCG/ML injection Inject 1 mL (1,000 mcg total) into the muscle  every 30 (thirty) days.  10 mL  0  . ferrous sulfate 325 (65 FE) MG tablet Take 1 tablet (325 mg total) by mouth 2 (two) times daily.  60 tablet  11  . ibuprofen (ADVIL,MOTRIN) 200 MG tablet Take 600-800 mg by mouth 2 (two) times daily as needed. For pain/headache      . Needles & Syringes MISC 1 mL by Does not apply route every 30 (thirty) days.  10 each  0  . valACYclovir (VALTREX) 500 MG tablet Take 1 tablet (500 mg total) by mouth 2 (two) times daily as needed (fever blisters ).  60 tablet  5  . [DISCONTINUED] JUNEL FE 1/20 1-20 MG-MCG tablet Take 1 tablet by mouth daily. Loestrin      . [DISCONTINUED] verapamil (CALAN) 120 MG tablet Take 120 mg by mouth 3 (three) times daily.        No current facility-administered medications on file prior to visit.    BP 100/70  Pulse 77  Temp(Src) 98.6 F (37 C) (Oral)  Resp 18  Wt 115 lb (52.164 kg)  BMI 21.74 kg/m2  SpO2 98%  LMP 07/12/2012       Review of Systems  Constitutional:  Negative.   HENT: Negative for hearing loss, congestion, sore throat, rhinorrhea, dental problem, sinus pressure and tinnitus.   Eyes: Negative for pain, discharge and visual disturbance.  Respiratory: Negative for cough and shortness of breath.   Cardiovascular: Negative for chest pain, palpitations and leg swelling.  Gastrointestinal: Negative for nausea, vomiting, abdominal pain, diarrhea, constipation, blood in stool and abdominal distention.  Genitourinary: Negative for dysuria, urgency, frequency, hematuria, flank pain, vaginal bleeding, vaginal discharge, difficulty urinating, vaginal pain and pelvic pain.  Musculoskeletal: Negative for joint swelling, arthralgias and gait problem.  Skin: Negative for rash.  Neurological: Positive for dizziness and light-headedness. Negative for syncope, speech difficulty, weakness, numbness and headaches.  Hematological: Negative for adenopathy.  Psychiatric/Behavioral: Negative for behavioral problems, dysphoric mood  and agitation. The patient is not nervous/anxious.        Objective:   Physical Exam  Constitutional: She is oriented to person, place, and time. She appears well-developed and well-nourished.  HENT:  Head: Normocephalic.  Right Ear: External ear normal.  Left Ear: External ear normal.  Mouth/Throat: Oropharynx is clear and moist.  Eyes: Conjunctivae and EOM are normal. Pupils are equal, round, and reactive to light.  Neck: Normal range of motion. Neck supple. No thyromegaly present.  Cardiovascular: Normal rate, regular rhythm, normal heart sounds and intact distal pulses.   Pulmonary/Chest: Effort normal and breath sounds normal.  Abdominal: Soft. Bowel sounds are normal. She exhibits no mass. There is no tenderness.  Musculoskeletal: Normal range of motion.  Lymphadenopathy:    She has no cervical adenopathy.  Neurological: She is alert and oriented to person, place, and time. She has normal reflexes. No cranial nerve deficit. Coordination normal.  Skin: Skin is warm and dry. No rash noted.  Psychiatric: She has a normal mood and affect. Her behavior is normal.          Assessment & Plan:   History of hyperglycemia. We'll check a hemoglobin A1c B12 deficiency. We'll continue monthly B12 shots History of iron deficiency anemia. We'll check a CBC

## 2012-12-20 ENCOUNTER — Telehealth: Payer: Self-pay | Admitting: Internal Medicine

## 2012-12-20 NOTE — Telephone Encounter (Signed)
[facility]  [facility] Triage Call Report Triage Record Num: [id number] Operator: Albertine Grates Patient Name: Janet Myers Call Date & Time: 12/17/2012 9:45:03PM Patient Phone: [phone] PCP: Gordy Savers Patient Gender: Female PCP Fax : [phone] Patient DOB: 1975/11/04 Practice Name: [facility] Reason for Call: Caller: [name]/Patient; PCP: Eleonore Chiquito (Family Practice > 81yrs old); CB#: [phone]; Has pain in lower abdomen on right side since 2000. Pain is constant. Worsens when moves. Per Abdominal Pain protocol, advised ED due to worsens with movement. Protocol(s) Used: Abdominal Pain Recommended Outcome per Protocol: See ED Immediately Reason for Outcome: Generalized or localized pain made worse with cough, movement, or standing up straight Care Advice: ~ 11/

## 2013-04-03 ENCOUNTER — Other Ambulatory Visit: Payer: Self-pay | Admitting: Family Medicine

## 2013-05-06 ENCOUNTER — Other Ambulatory Visit: Payer: Self-pay | Admitting: Family Medicine

## 2013-05-09 ENCOUNTER — Other Ambulatory Visit: Payer: Self-pay | Admitting: Family Medicine

## 2013-05-26 ENCOUNTER — Other Ambulatory Visit: Payer: Self-pay | Admitting: Internal Medicine

## 2013-05-28 ENCOUNTER — Other Ambulatory Visit: Payer: Self-pay | Admitting: Family Medicine

## 2013-05-30 ENCOUNTER — Encounter: Payer: Self-pay | Admitting: Internal Medicine

## 2013-05-30 ENCOUNTER — Ambulatory Visit (INDEPENDENT_AMBULATORY_CARE_PROVIDER_SITE_OTHER): Payer: No Typology Code available for payment source | Admitting: Internal Medicine

## 2013-05-30 VITALS — BP 100/60 | HR 59 | Temp 98.6°F | Resp 18 | Ht 61.0 in | Wt 129.0 lb

## 2013-05-30 DIAGNOSIS — G44009 Cluster headache syndrome, unspecified, not intractable: Secondary | ICD-10-CM

## 2013-05-30 MED ORDER — ESCITALOPRAM OXALATE 10 MG PO TABS: ORAL_TABLET | ORAL | Status: DC

## 2013-05-30 MED ORDER — METHYLPREDNISOLONE (PAK) 4 MG PO TABS: ORAL_TABLET | ORAL | Status: DC

## 2013-05-30 NOTE — Progress Notes (Signed)
Pre-visit discussion using our clinic review tool. No additional management support is needed unless otherwise documented below in the visit note.  

## 2013-05-30 NOTE — Patient Instructions (Signed)
Call or return to clinic prn if these symptoms worsen or fail to improve as anticipated.

## 2013-05-30 NOTE — Progress Notes (Signed)
Subjective:    Patient ID: Janet Myers, female    DOB: Oct 19, 1975, 38 y.o.   MRN: [id number]  HPI  38 year old patient who states she has a history of cluster headaches since age 38.  She has been evaluated and treated at the local headache Center.    Effective treatment has included a Medrol Dosepak.  She has had 2 significant headaches over the past few days and feels that she is to have further cluster headaches.  These have been fairly classic headaches for her She also has a history of B12 deficiency and has been on monthly injection therapy  Past Medical History  Diagnosis Date  . Headache(784.0)   . Anxiety     father recently passed away  . Gallstones     History   Social History  . Marital Status: Married    Spouse Name: N/A    Number of Children: N/A  . Years of Education: N/A   Occupational History  . Not on file.   Social History Main Topics  . Smoking status: Never Smoker   . Smokeless tobacco: Never Used  . Alcohol Use: Yes     Comment: rare  . Drug Use: No  . Sexual Activity: Not on file   Other Topics Concern  . Not on file   Social History Narrative  . No narrative on file    Past Surgical History  Procedure Laterality Date  . Eye surgery    . Cesarean section    . Laparoscopic tubal ligation  04/21/2011    Procedure: LAPAROSCOPIC TUBAL LIGATION;  Surgeon: Marylynn Pearson, MD;  Location: [facility];  Service: Gynecology;  Laterality: Bilateral;  Bilateral with Filshie Clips    Family History  Problem Relation Age of Onset  . Depression Mother   . Hepatitis Mother     c  . Osteopenia Mother   . Alcohol abuse Father   . COPD Father   . Heart disease Father   . Cirrhosis Father   . Hepatitis Father     c  . Osteopenia Father     Allergies  Allergen Reactions  . Codeine Nausea Only    Can take Percocet (not Vicodin)    Current Outpatient Prescriptions on File Prior to Visit  Medication Sig Dispense Refill  . cyanocobalamin  (,VITAMIN B-12,) 1000 MCG/ML injection Inject 1 mL (1,000 mcg total) into the muscle every 30 (thirty) days.  10 mL  1  . ibuprofen (ADVIL,MOTRIN) 200 MG tablet Take 600-800 mg by mouth 2 (two) times daily as needed. For pain/headache      . NEEDLE, DISP, 25 G (B-D DISP NEEDLE 25GX1") 25G X 1" MISC 1 each by Does not apply route every 30 (thirty) days.  10 each  1  . Needles & Syringes MISC 1 mL by Does not apply route every 30 (thirty) days.  10 each  0  . Syringe, Disposable, (B-D SYRINGE LUER-LOK 3CC) 3 ML MISC 1 each by Does not apply route every 30 (thirty) days.  25 each  1  . valACYclovir (VALTREX) 500 MG tablet TAKE 1 TABLET (500 MG TOTAL) BY MOUTH 2 (TWO) TIMES DAILY AS NEEDED (FEVER BLISTERS ).  60 tablet  2  . ferrous sulfate 325 (65 FE) MG tablet Take 1 tablet (325 mg total) by mouth 2 (two) times daily.  60 tablet  11  . [DISCONTINUED] JUNEL FE 1/20 1-20 MG-MCG tablet Take 1 tablet by mouth daily. Loestrin      . [  DISCONTINUED] verapamil (CALAN) 120 MG tablet Take 120 mg by mouth 3 (three) times daily.        No current facility-administered medications on file prior to visit.    BP 100/60  Pulse 59  Temp(Src) 98.6 F (37 C) (Oral)  Resp 18  Ht 5\' 1"  (1.549 m)  Wt 129 lb (58.514 kg)  BMI 24.39 kg/m2  SpO2 98%  LMP 05/27/2013       Review of Systems  Constitutional: Negative.   HENT: Negative for congestion, dental problem, hearing loss, rhinorrhea, sinus pressure, sore throat and tinnitus.   Eyes: Negative for pain, discharge and visual disturbance.  Respiratory: Negative for cough and shortness of breath.   Cardiovascular: Negative for chest pain, palpitations and leg swelling.  Gastrointestinal: Negative for nausea, vomiting, abdominal pain, diarrhea, constipation, blood in stool and abdominal distention.  Genitourinary: Negative for dysuria, urgency, frequency, hematuria, flank pain, vaginal bleeding, vaginal discharge, difficulty urinating, vaginal pain and pelvic  pain.  Musculoskeletal: Negative for arthralgias, gait problem and joint swelling.  Skin: Negative for rash.  Neurological: Positive for headaches. Negative for dizziness, syncope, speech difficulty, weakness and numbness.  Hematological: Negative for adenopathy.  Psychiatric/Behavioral: Negative for behavioral problems, dysphoric mood and agitation. The patient is nervous/anxious.        Objective:   Physical Exam  Constitutional: She is oriented to person, place, and time. She appears well-developed and well-nourished.  HENT:  Head: Normocephalic.  Right Ear: External ear normal.  Left Ear: External ear normal.  Mouth/Throat: Oropharynx is clear and moist.  Eyes: Conjunctivae and EOM are normal. Pupils are equal, round, and reactive to light.  Neck: Normal range of motion. Neck supple. No thyromegaly present.  Cardiovascular: Normal rate, regular rhythm, normal heart sounds and intact distal pulses.   Pulmonary/Chest: Effort normal and breath sounds normal.  Abdominal: Soft. Bowel sounds are normal. She exhibits no mass. There is no tenderness.  Musculoskeletal: Normal range of motion.  Lymphadenopathy:    She has no cervical adenopathy.  Neurological: She is alert and oriented to person, place, and time.  Skin: Skin is warm and dry. No rash noted.  Psychiatric: She has a normal mood and affect. Her behavior is normal.          Assessment & Plan:   History of cluster headaches.  These have been treated and had responded well to Medrol.  Will refill.  Patient has not required Medrol in over one year B12 deficiency.  Option of oral therapy with 1 mg daily, B12 tablets discussed Anxiety disorder.  Lexapro refilled

## 2013-06-24 ENCOUNTER — Telehealth: Payer: Self-pay | Admitting: Internal Medicine

## 2013-06-24 DIAGNOSIS — G44009 Cluster headache syndrome, unspecified, not intractable: Secondary | ICD-10-CM

## 2013-06-24 NOTE — Telephone Encounter (Signed)
Left message on voicemail to call office. Order for Neurology referral done.

## 2013-06-24 NOTE — Telephone Encounter (Signed)
Okay for referral?

## 2013-06-24 NOTE — Telephone Encounter (Signed)
ok 

## 2013-06-24 NOTE — Telephone Encounter (Signed)
Pt called and is requesting a referral to see [facility] neurologist  Dr Tomi Likens

## 2013-07-04 ENCOUNTER — Encounter: Payer: Self-pay | Admitting: *Deleted

## 2013-07-04 ENCOUNTER — Encounter: Payer: Self-pay | Admitting: Neurology

## 2013-07-04 ENCOUNTER — Ambulatory Visit (INDEPENDENT_AMBULATORY_CARE_PROVIDER_SITE_OTHER): Payer: No Typology Code available for payment source | Admitting: Neurology

## 2013-07-04 VITALS — BP 100/60 | HR 66 | Temp 98.2°F | Ht 61.0 in | Wt 128.3 lb

## 2013-07-04 DIAGNOSIS — G44009 Cluster headache syndrome, unspecified, not intractable: Secondary | ICD-10-CM

## 2013-07-04 MED ORDER — ZOLMITRIPTAN 5 MG NA SOLN: NASAL | Status: DC

## 2013-07-04 MED ORDER — PREDNISONE 10 MG PO TABS: ORAL_TABLET | ORAL | Status: DC

## 2013-07-04 MED ORDER — VERAPAMIL HCL 40 MG PO TABS: ORAL_TABLET | ORAL | Status: DC

## 2013-07-04 NOTE — Progress Notes (Signed)
NEUROLOGY CONSULTATION NOTE  Portia Wisdom MRN: [id number] DOB: 06/20/1975  Referring provider: Dr. Bluford Kaufmann Primary care provider: Dr. Bluford Kaufmann  Reason for consult:  Cluster headaches  Dear Dr Burnice Logan:  Thank you for your kind referral of Lucerito Hammack for consultation of the above symptoms. Although her history is well known to you, please allow me to reiterate it for the purpose of our medical record. Records and images were personally reviewed where available.  HISTORY OF PRESENT ILLNESS: This is a pleasant 38 year old right-handed woman with a history of headaches since age 41 or 49.  Headaches are over the right frontal region, radiating down to one of her right upper molars.  She feels a stabbing pain. Lasting 30 minutes up to 3 hours, with associated photo and phonophobia, no nausea/vomiting, tearing, or conjunctival injection.  She has been seeing headache specialist Dr. Catalina Gravel for the past 10 years and has been diagnosed with cluster headaches. She typically would get a month of prednisone tapering course with resolution of headaches.  She presents today with a prolonged cluster headache, now lasting for almost a month.  The last cluster headache she had was a year ago.  Her longest headache-free interval was 2 years.  This headache now radiates to the right occipital region.  She took 6 days of a Medrol dose pack, which initially helped, however headaches recurred when she finished the pack.  Over the past week, she has been taking Ibuprofen 800mg  twice a day, but this only eases it off a little.  The headaches can wake her up at night and last all day.  She recalls trying Topamax in the past but still had headaches.  Imitrex did not help.    She had dizziness recently secondary to anemia, which has resolved, unrelated to the headaches.  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  She denies any diplopia, dysarthria,  dysphagia, neck/back pain, bowel/bladder dysfunction. No head injuries.  She has a history of left eye surgery.   PAST MEDICAL HISTORY: Past Medical History  Diagnosis Date  . Headache(784.0)   . Anxiety     father recently passed away  . Gallstones     PAST SURGICAL HISTORY: Past Surgical History  Procedure Laterality Date  . Eye surgery    . Cesarean section    . Laparoscopic tubal ligation  04/21/2011    Procedure: LAPAROSCOPIC TUBAL LIGATION;  Surgeon: Marylynn Pearson, MD;  Location: [facility];  Service: Gynecology;  Laterality: Bilateral;  Bilateral with Filshie Clips    MEDICATIONS: Current Outpatient Prescriptions on File Prior to Visit  Medication Sig Dispense Refill  . escitalopram (LEXAPRO) 10 MG tablet TAKE 1 TABLET BY MOUTH EVERY DAY  30 tablet  2  . ferrous sulfate 325 (65 FE) MG tablet Take 1 tablet (325 mg total) by mouth 2 (two) times daily.  60 tablet  11  . ibuprofen (ADVIL,MOTRIN) 200 MG tablet Take 600-800 mg by mouth 2 (two) times daily as needed. For pain/headache      . NEEDLE, DISP, 25 G (B-D DISP NEEDLE 25GX1") 25G X 1" MISC 1 each by Does not apply route every 30 (thirty) days.  10 each  1  . Needles & Syringes MISC 1 mL by Does not apply route every 30 (thirty) days.  10 each  0  . Syringe, Disposable, (B-D SYRINGE LUER-LOK 3CC) 3 ML MISC 1 each by Does not apply route every  30 (thirty) days.  25 each  1  . valACYclovir (VALTREX) 500 MG tablet TAKE 1 TABLET (500 MG TOTAL) BY MOUTH 2 (TWO) TIMES DAILY AS NEEDED (FEVER BLISTERS ).  60 tablet  2  . cyanocobalamin (,VITAMIN B-12,) 1000 MCG/ML injection Inject 1 mL (1,000 mcg total) into the muscle every 30 (thirty) days.  10 mL  1  . methylPREDNIsolone (MEDROL DOSPACK) 4 MG tablet follow package directions  21 tablet  0  . [DISCONTINUED] JUNEL FE 1/20 1-20 MG-MCG tablet Take 1 tablet by mouth daily. Loestrin       No current facility-administered medications on file prior to visit.    ALLERGIES: Allergies    Allergen Reactions  . Codeine Nausea Only    Can take Percocet (not Vicodin)    FAMILY HISTORY: Family History  Problem Relation Age of Onset  . Depression Mother   . Hepatitis Mother     c  . Osteopenia Mother   . Alcohol abuse Father   . COPD Father   . Heart disease Father   . Cirrhosis Father   . Hepatitis Father     c  . Osteopenia Father     SOCIAL HISTORY: History   Social History  . Marital Status: Married    Spouse Name: N/A    Number of Children: N/A  . Years of Education: N/A   Occupational History  . Not on file.   Social History Main Topics  . Smoking status: Never Smoker   . Smokeless tobacco: Never Used  . Alcohol Use: Yes     Comment: rare  . Drug Use: No  . Sexual Activity: Not on file   Other Topics Concern  . Not on file   Social History Narrative  . No narrative on file    REVIEW OF SYSTEMS: Constitutional: No fevers, chills, or sweats, no generalized fatigue, change in appetite Eyes: No visual changes, double vision, eye pain Ear, nose and throat: No hearing loss, ear pain, nasal congestion, sore throat Cardiovascular: No chest pain, palpitations Respiratory:  No shortness of breath at rest or with exertion, wheezes GastrointestinaI: No nausea, vomiting, diarrhea, abdominal pain, fecal incontinence Genitourinary:  No dysuria, urinary retention or frequency Musculoskeletal:  No neck pain, back pain Integumentary: No rash, pruritus, skin lesions Neurological: as above Psychiatric: No depression, insomnia, anxiety Endocrine: No palpitations, fatigue, diaphoresis, mood swings, change in appetite, change in weight, increased thirst Hematologic/Lymphatic:  No anemia, purpura, petechiae. Allergic/Immunologic: no itchy/runny eyes, nasal congestion, recent allergic reactions, rashes  PHYSICAL EXAM: Filed Vitals:   07/04/13 0821  BP: 100/60  Pulse: 66  Temp: 98.2 F (36.8 C)   General: No acute distress Head:   Normocephalic/atraumatic Eyes: Fundoscopic exam shows bilateral sharp discs, no vessel changes, exudates, or hemorrhages Neck: supple, no paraspinal tenderness, full range of motion Back: No paraspinal tenderness Heart: regular rate and rhythm Lungs: Clear to auscultation bilaterally. Vascular: No carotid bruits. Skin/Extremities: No rash, no edema Neurological Exam: Mental status: alert and oriented to person, place, and time, no dysarthria or aphasia, Fund of knowledge is appropriate.  Recent and remote memory are intact.  Attention and concentration are normal.    Able to name objects and repeat phrases. Cranial nerves: CN I: not tested CN II: pupils equal, round and reactive to light, visual fields intact, fundi unremarkable. CN III, IV, VI:  full range of motion with slight left eye esophoria, no nystagmus, no ptosis CN V: facial sensation intact CN VII: upper and lower  face symmetric CN VIII: hearing intact to finger rub CN IX, X: gag intact, uvula midline CN XI: sternocleidomastoid and trapezius muscles intact CN XII: tongue midline Bulk & Tone: normal, no fasciculations. Motor: 5/5 throughout with no pronator drift. Sensation: intact to light touch, cold, pin, vibration and joint position sense.  No extinction to double simultaneous stimulation.  Romberg test negative Deep Tendon Reflexes: +2 throughout, no ankle clonus Plantar responses: downgoing bilaterally Cerebellar: no incoordination on finger to nose, heel to shin. No dysdiadochokinesia Gait: narrow-based and steady, able to tandem walk adequately. Tremor: none  IMPRESSION: This is a pleasant 38 year old right-handed woman with a history of cluster headaches typically relieved by prednisone taper.  Prior to this recent month-long cluster headache episode, the last headache bout was a year ago.  She will start another tapering course of higher dose Prednisone 60mg  then tapering over a week.  We discussed starting daily  prophylactic medication for cluster headaches, verapamil has been found to be effective and she will start on low dose 40mg  daily with uptitration as tolerated. Side effects were discussed. We discussed minimizing rescue medication use to 2-3/week to avoid rebound headaches, which she may have a component of at this point.  She will use Zomig nasal spray for rescue.  She will keep a headache diary and follow-up in 2 months.  Thank you for allowing me to participate in the care of this patient. Please do not hesitate to call for any questions or concerns.   Ellouise Newer, M.D.

## 2013-07-04 NOTE — Patient Instructions (Signed)
1. Start Verapamil 40mg  tabs: take 1 tab at bedtime for 1 week, then increase to 1 tab twice a day 2. Start Prednisone 10mg  tabs: Take 6 tabs on day 1, 5 tabs on day 2, 4 tabs on day 3, 3 tabs on day 4, 2 tabs on day days 5 and 6, 1 tab on days 7 and 8 3. Use Zomig nasal spray as needed at onset of headache 4. For any rescue medication, do not take more than 2-3 times a week 5. Keep headache diary

## 2013-07-13 ENCOUNTER — Telehealth: Payer: Self-pay | Admitting: Neurology

## 2013-07-13 DIAGNOSIS — G44009 Cluster headache syndrome, unspecified, not intractable: Secondary | ICD-10-CM

## 2013-07-13 NOTE — Telephone Encounter (Signed)
Pt still having cluster headaches. She also says her pharmacy is waiting for prior approval for one of her meds and wants to follow up on that as well. CB# [id number] or [phone] / Janet Myers

## 2013-07-14 MED ORDER — RIZATRIPTAN BENZOATE 10 MG PO TBDP: ORAL_TABLET | ORAL | Status: DC

## 2013-07-14 NOTE — Telephone Encounter (Signed)
Pls let her know let's try Maxalt, Rx sent to pharm. Take 1 tab at onset of headache, may take 2nd dose after 2 hours if needed. Do not take more than 3 a week.

## 2013-07-14 NOTE — Telephone Encounter (Signed)
Unable to get to Zomig approved she needs to have tried three of the other triptans before they would approve this. Patient has agreed to try Maxalt, Relpax. She has tried Imitrex but it does not work

## 2013-07-14 NOTE — Telephone Encounter (Signed)
Patient notified

## 2013-07-20 ENCOUNTER — Ambulatory Visit: Payer: No Typology Code available for payment source | Admitting: Neurology

## 2013-07-25 ENCOUNTER — Telehealth: Payer: Self-pay | Admitting: Neurology

## 2013-07-25 NOTE — Telephone Encounter (Signed)
meds for cluster headaches not working. Please call [phone] / Sherri S.

## 2013-07-26 ENCOUNTER — Other Ambulatory Visit: Payer: Self-pay | Admitting: *Deleted

## 2013-07-26 MED ORDER — ELETRIPTAN HYDROBROMIDE 20 MG PO TABS: 20.0000 mg | ORAL_TABLET | ORAL | Status: DC | PRN

## 2013-07-26 NOTE — Telephone Encounter (Signed)
Please let her know to increase Verapamil to 1 tab in AM, 2 tabs in PM.  If the Maxalt is not working, let's try Relpax 20mg , take 1 tab at onset of headache, may take 2nd dose in 2 hours. Do not take more than 3 /week. Dispense #10.  Thanks.

## 2013-07-26 NOTE — Telephone Encounter (Signed)
Please advise 

## 2013-07-26 NOTE — Telephone Encounter (Signed)
Patient advised and Rx sent to the pharmacy

## 2013-08-16 ENCOUNTER — Telehealth: Payer: Self-pay | Admitting: Neurology

## 2013-08-16 NOTE — Telephone Encounter (Signed)
Needs to talk to someone about some medication [phone]

## 2013-08-16 NOTE — Telephone Encounter (Signed)
Spoke with patient verified with pharmacy that her Rx for Relpax had been filled and ready for pick up

## 2013-09-20 ENCOUNTER — Encounter: Payer: Self-pay | Admitting: Neurology

## 2013-09-20 ENCOUNTER — Ambulatory Visit (INDEPENDENT_AMBULATORY_CARE_PROVIDER_SITE_OTHER): Payer: No Typology Code available for payment source | Admitting: Neurology

## 2013-09-20 VITALS — BP 116/64 | HR 78 | Resp 16 | Ht 61.0 in | Wt 130.0 lb

## 2013-09-20 DIAGNOSIS — G44019 Episodic cluster headache, not intractable: Secondary | ICD-10-CM

## 2013-09-20 DIAGNOSIS — R51 Headache: Secondary | ICD-10-CM

## 2013-09-20 MED ORDER — ZOLMITRIPTAN 5 MG PO TABS: 5.0000 mg | ORAL_TABLET | ORAL | Status: DC | PRN

## 2013-09-20 MED ORDER — VERAPAMIL HCL ER 120 MG PO TBCR: EXTENDED_RELEASE_TABLET | ORAL | Status: DC

## 2013-09-20 NOTE — Progress Notes (Signed)
NEUROLOGY FOLLOW UP OFFICE NOTE  Janet Myers [id number]  HISTORY OF PRESENT ILLNESS: I had the pleasure of seeing Mohini Heathcock in follow-up in the neurology clinic on 09/20/2013.  The patient was last seen 2 months ago for increased frequency of cluster headaches. She usually has relief with prednisone tapers, however has had 2 of these with no significant relief.  She started Verapamil for headache prophylaxis, currently on low dose with no side effects. She was drowsy initially, which has resolved. She continues to have headaches several times a week, with associated photophobia. The headaches usually occur in the evening, or wake her up from sleep.  Headaches are lateralized to the right hemisphere, but now radiate down the right side of her neck occasionally.  No nausea/vomiting.  No focal numbness/tingling/weakness.  Maxalt may or may not help all the time, she minimizes use to 3/week.  HPI:  This is a pleasant 38 yo RH woman with a history of headaches since age 38 or 38. Headaches are over the right frontal region, radiating down to one of her right upper molars. She feels a stabbing pain. Lasting 30 minutes up to 3 hours, with associated photo and phonophobia, no nausea/vomiting, tearing, or conjunctival injection. She has been seeing headache specialist Dr. Catalina Gravel for the past 10 years and has been diagnosed with cluster headaches. She typically would get a month of prednisone tapering course with resolution of headaches. She presented in June 2015 with a prolonged cluster headache, lasting for almost a month. The last cluster headache she had was a year prior. Her longest headache-free interval was 2 years. This headache now radiates to the right occipital region. She took 6 days of a Medrol dose pack, which initially helped, however headaches recurred when she finished the pack. She recalls trying Topamax in the past but still had headaches. Imitrex did not help.   She has chronic  fatigue secondary to anemia.     PAST MEDICAL HISTORY: Past Medical History  Diagnosis Date  . Headache(784.0)   . Anxiety     father recently passed away  . Gallstones     MEDICATIONS: Current Outpatient Prescriptions on File Prior to Visit  Medication Sig Dispense Refill  . rizatriptan (MAXALT-MLT) 10 MG disintegrating tablet Take at onset of headache, May repeat in 2 hours if needed. Do not take more than 3/week  9 tablet  11  . valACYclovir (VALTREX) 500 MG tablet TAKE 1 TABLET (500 MG TOTAL) BY MOUTH 2 (TWO) TIMES DAILY AS NEEDED (FEVER BLISTERS ).  60 tablet  2  . [DISCONTINUED] JUNEL FE 1/20 1-20 MG-MCG tablet  Verapamil 40mg  BID Take 1 tablet by mouth daily. Loestrin       No current facility-administered medications on file prior to visit.    ALLERGIES: Allergies  Allergen Reactions  . Codeine Nausea Only    Can take Percocet (not Vicodin)    FAMILY HISTORY: Family History  Problem Relation Age of Onset  . Depression Mother   . Hepatitis Mother     c  . Osteopenia Mother   . Alcohol abuse Father   . COPD Father   . Heart disease Father   . Cirrhosis Father   . Hepatitis Father     c  . Osteopenia Father     SOCIAL HISTORY: History   Social History  . Marital Status: Married    Spouse Name: N/A    Number of Children: N/A  . Years of Education: N/A  Occupational History  . Not on file.   Social History Main Topics  . Smoking status: Never Smoker   . Smokeless tobacco: Never Used  . Alcohol Use: Yes     Comment: rare  . Drug Use: No  . Sexual Activity: Not on file   Other Topics Concern  . Not on file   Social History Narrative  . No narrative on file    REVIEW OF SYSTEMS: Constitutional: No fevers, chills, or sweats, no generalized fatigue, change in appetite Eyes: No visual changes, double vision, eye pain Ear, nose and throat: No hearing loss, ear pain, nasal congestion, sore throat Cardiovascular: No chest pain,  palpitations Respiratory:  No shortness of breath at rest or with exertion, wheezes GastrointestinaI: No nausea, vomiting, diarrhea, abdominal pain, fecal incontinence Genitourinary:  No dysuria, urinary retention or frequency Musculoskeletal:  No neck pain, back pain Integumentary: No rash, pruritus, skin lesions Neurological: as above Psychiatric: No depression, insomnia, anxiety Endocrine: No palpitations, fatigue, diaphoresis, mood swings, change in appetite, change in weight, increased thirst Hematologic/Lymphatic:  No anemia, purpura, petechiae. Allergic/Immunologic: no itchy/runny eyes, nasal congestion, recent allergic reactions, rashes  PHYSICAL EXAM: Filed Vitals:   09/20/13 1536  BP: 116/64  Pulse: 78  Resp: 16   General: No acute distress Head:  Normocephalic/atraumatic Neck: supple, no paraspinal tenderness, full range of motion Heart:  Regular rate and rhythm Lungs:  Clear to auscultation bilaterally Back: No paraspinal tenderness Skin/Extremities: No rash, no edema Neurological Exam: alert and oriented to person, place, and time, no dysarthria or aphasia, Fund of knowledge is appropriate. Recent and remote memory are intact. Attention and concentration are normal. Able to name objects and repeat phrases.  Cranial nerves:  CN I: not tested  CN II: pupils equal, round and reactive to light, visual fields intact, fundi unremarkable.  CN III, IV, VI: full range of motion with slight left eye esophoria, no nystagmus, no ptosis  CN V: facial sensation intact  CN VII: upper and lower face symmetric  CN VIII: hearing intact to finger rub  CN IX, X: gag intact, uvula midline  CN XI: sternocleidomastoid and trapezius muscles intact  CN XII: tongue midline  Bulk & Tone: normal, no fasciculations.  Motor: 5/5 throughout with no pronator drift.  Sensation: intact to light touch. No extinction to double simultaneous stimulation. Romberg test negative  Deep Tendon Reflexes: +2  throughout, no ankle clonus  Plantar responses: downgoing bilaterally  Cerebellar: no incoordination on finger to nose, heel to shin. No dysdiadochokinesia  Gait: narrow-based and steady, able to tandem walk adequately.  Tremor: none   IMPRESSION:  This is a pleasant 38 yo RH woman with a history of cluster headaches typically relieved by prednisone taper. She presented 2 months ago with a month-long duration of cluster headaches, prior to this, the last headache bout was a year ago. She took another course of steroids and started low dose Verapamil, but continues to have frequent headaches. The increased frequency and lack of response to Prednisone is a change in her typical headaches, MRI brain without contrast will be ordered to assess for underlying structural abnormality.  She will increase Verapamil to Verapamil ER 120mg  daily x 2 weeks, then increase to 240mg /day.  She will try Zomig for rescue, and will be set up for oxygen treatment for acute treatment of headaches. She will use 59ml/min for 15 minutes at onset of headache.  She will keep a headache diary and follow-up in 4 months.  Thank you  for allowing me to participate in her care.  Please do not hesitate to call for any questions or concerns.  The duration of this appointment visit was 25 minutes of face-to-face time with the patient.  Greater than 50% of this time was spent in counseling, explanation of diagnosis, planning of further management, and coordination of care.   Ellouise Newer, M.D.   CC: Dr. Burnice Logan

## 2013-09-20 NOTE — Patient Instructions (Signed)
1. Increase Verapamil to Verapamil extended-release 120mg : Take 1 tab at bedtime for 2 weeks, then 2 tabs at bedtime 2. Take Zomig 5mg  as needed for headaches, do not take more than 3 a week 3. We will set you up with oxygen rescue therapy for the cluster headaches, use 60ml/min for 15 minutes at onset of headaches 4. Schedule MRI brain without contrast

## 2013-09-21 ENCOUNTER — Encounter: Payer: Self-pay | Admitting: Family Medicine

## 2013-09-21 ENCOUNTER — Other Ambulatory Visit: Payer: Self-pay | Admitting: Family Medicine

## 2013-09-21 ENCOUNTER — Telehealth: Payer: Self-pay | Admitting: Neurology

## 2013-09-21 DIAGNOSIS — R51 Headache: Secondary | ICD-10-CM

## 2013-09-21 DIAGNOSIS — G44019 Episodic cluster headache, not intractable: Secondary | ICD-10-CM

## 2013-09-21 NOTE — Telephone Encounter (Signed)
Note faxed.

## 2013-09-21 NOTE — Telephone Encounter (Signed)
Pt needs a work note faxed to her at [phone] she was seen yesterday

## 2013-09-22 ENCOUNTER — Telehealth: Payer: Self-pay | Admitting: Family Medicine

## 2013-09-22 NOTE — Telephone Encounter (Signed)
Called patient to let her know that order was ready for O2 therapy. She request that I faxed order to her. Will fax order to [phone] along with information for the [employer] store where she can go get O2 & supplies.

## 2013-10-05 ENCOUNTER — Ambulatory Visit (HOSPITAL_COMMUNITY): Admission: RE | Admit: 2013-10-05 | Payer: No Typology Code available for payment source | Source: Ambulatory Visit

## 2014-01-13 ENCOUNTER — Ambulatory Visit: Payer: No Typology Code available for payment source | Admitting: Neurology

## 2014-02-03 ENCOUNTER — Encounter: Payer: Self-pay | Admitting: Neurology

## 2014-02-03 ENCOUNTER — Ambulatory Visit (INDEPENDENT_AMBULATORY_CARE_PROVIDER_SITE_OTHER): Payer: No Typology Code available for payment source | Admitting: Neurology

## 2014-02-03 VITALS — BP 100/70 | HR 73 | Resp 16 | Ht 61.0 in | Wt 116.0 lb

## 2014-02-03 DIAGNOSIS — G44019 Episodic cluster headache, not intractable: Secondary | ICD-10-CM

## 2014-02-03 MED ORDER — VERAPAMIL HCL ER 120 MG PO TBCR: EXTENDED_RELEASE_TABLET | ORAL | Status: DC

## 2014-02-03 NOTE — Patient Instructions (Signed)
1. Restart Verapamil SR 120mg : Take 1 tablet at bedtime 2. Use home O2 as needed for cluster attacks; may use Zomig as well as needed 3. Follow-up in 14months, keep a headache diary, call for any problems

## 2014-02-03 NOTE — Progress Notes (Signed)
NEUROLOGY FOLLOW UP OFFICE NOTE  Janet Myers [id number]  HISTORY OF PRESENT ILLNESS: I had the pleasure of seeing Janet Myers in follow-up in the neurology clinic on 02/03/2014.  The patient was last seen 5 months ago for cluster headaches. She reports that she has been doing good since her last visit, with no attacks of cluster headaches in the past 5 months. She has not needed to take the Zomig and did not get the O2 tank. She misses doses of Verapamil, no side effects when she takes it. She reports her right tooth has been bothering her, every once in a while this is how a cluster headache starts, but she will be seeing her dentist soon.    HPI: This is a pleasant 39 yo RH woman with a history of headaches since age 51 or 47. Headaches are over the right frontal region, radiating down to one of her right upper molars. She feels a stabbing pain lasting 30 minutes up to 3 hours, with associated photo and phonophobia, no nausea/vomiting, tearing, or conjunctival injection. She has been seeing headache specialist Dr. Catalina Gravel for the past 10 years and has been diagnosed with cluster headaches. She typically would get a month of prednisone tapering course with resolution of headaches. She presented in June 2015 with a prolonged cluster headache, lasting for almost a month. The last cluster headache she had was a year prior. Her longest headache-free interval was 2 years. She took 6 days of a Medrol dose pack, which initially helped, however headaches recurred when she finished the pack. She recalls trying Topamax in the past but still had headaches. Imitrex did not help.   PAST MEDICAL HISTORY: Past Medical History  Diagnosis Date  . Headache(784.0)   . Anxiety     father recently passed away  . Gallstones     MEDICATIONS: Current Outpatient Prescriptions on File Prior to Visit  Medication Sig Dispense Refill  . valACYclovir (VALTREX) 500 MG tablet TAKE 1 TABLET (500 MG TOTAL) BY MOUTH 2  (TWO) TIMES DAILY AS NEEDED (FEVER BLISTERS ). 60 tablet 2  . verapamil (CALAN-SR) 120 MG CR tablet Take 1 tablet daily for 2 weeks, then increase to 2 tabs daily (Patient taking differently: 120 mg 2 (two) times daily. ) 60 tablet 4  . zolmitriptan (ZOMIG) 5 MG tablet Take 1 tablet (5 mg total) by mouth as needed for migraine. May take second dose after 2 hours if headache persists. Do not take more than 3 a week (Patient not taking: Reported on 02/03/2014) 10 tablet 6  . [DISCONTINUED] JUNEL FE 1/20 1-20 MG-MCG tablet Take 1 tablet by mouth daily. Loestrin     No current facility-administered medications on file prior to visit.    ALLERGIES: Allergies  Allergen Reactions  . Codeine Nausea Only    Can take Percocet (not Vicodin)    FAMILY HISTORY: Family History  Problem Relation Age of Onset  . Depression Mother   . Hepatitis Mother     c  . Osteopenia Mother   . Alcohol abuse Father   . COPD Father   . Heart disease Father   . Cirrhosis Father   . Hepatitis Father     c  . Osteopenia Father     SOCIAL HISTORY: History   Social History  . Marital Status: Married    Spouse Name: N/A    Number of Children: N/A  . Years of Education: N/A   Occupational History  . Not on file.  Social History Main Topics  . Smoking status: Never Smoker   . Smokeless tobacco: Never Used  . Alcohol Use: Yes     Comment: rare  . Drug Use: No  . Sexual Activity: Not on file   Other Topics Concern  . Not on file   Social History Narrative  . No narrative on file    REVIEW OF SYSTEMS: Constitutional: No fevers, chills, or sweats, no generalized fatigue, change in appetite Eyes: No visual changes, double vision, eye pain Ear, nose and throat: No hearing loss, ear pain, nasal congestion, sore throat Cardiovascular: No chest pain, palpitations Respiratory:  No shortness of breath at rest or with exertion, wheezes GastrointestinaI: No nausea, vomiting, diarrhea, abdominal pain,  fecal incontinence Genitourinary:  No dysuria, urinary retention or frequency Musculoskeletal:  No neck pain, back pain Integumentary: No rash, pruritus, skin lesions Neurological: as above Psychiatric: No depression, insomnia, anxiety Endocrine: No palpitations, fatigue, diaphoresis, mood swings, change in appetite, change in weight, increased thirst Hematologic/Lymphatic:  No anemia, purpura, petechiae. Allergic/Immunologic: no itchy/runny eyes, nasal congestion, recent allergic reactions, rashes  PHYSICAL EXAM: Filed Vitals:   02/03/14 1322  BP: 100/70  Pulse: 73  Resp: 16   General: No acute distress Head:  Normocephalic/atraumatic Neck: supple, no paraspinal tenderness, full range of motion Heart:  Regular rate and rhythm Lungs:  Clear to auscultation bilaterally Back: No paraspinal tenderness Skin/Extremities: No rash, no edema Neurological Exam: alert and oriented to person, place, and time. No aphasia or dysarthria. Fund of knowledge is appropriate.  Recent and remote memory are intact.  Attention and concentration are normal.    Able to name objects and repeat phrases. Cranial nerves: Pupils equal, round, reactive to light.  Fundoscopic exam unremarkable, no papilledema. Extraocular movements intact with no nystagmus. Visual fields full. Facial sensation intact. No facial asymmetry. Tongue, uvula, palate midline.  Motor: Bulk and tone normal, muscle strength 5/5 throughout with no pronator drift.  Sensation to light touch intact.  No extinction to double simultaneous stimulation.  Deep tendon reflexes 2+ throughout, toes downgoing.  Finger to nose testing intact.  Gait narrow-based and steady, able to tandem walk adequately.  Romberg negative.  IMPRESSION: This is a pleasant 39 yo RH woman with a history of cluster headaches typically relieved by prednisone taper. She presented in June 2015 with a month-long duration of cluster headaches, prior to this, the last headache bout was  a year prior. MRI brain normal. She has been headache-free now for 5 months and intermittently takes Verapamil. We discussed the rationale behind headache prophylactic medications, she will take Verapamil CR 120mg  daily and will use prn Zomig or O2 therapy for rescue, 71ml/min for 15 minutes at onset of headache. She will keep a headache diary and follow-up in 6 months or earlier if needed.   Thank you for allowing me to participate in her care.  Please do not hesitate to call for any questions or concerns.  The duration of this appointment visit was 15 minutes of face-to-face time with the patient.  Greater than 50% of this time was spent in counseling, explanation of diagnosis, planning of further management, and coordination of care.   Ellouise Newer, M.D.   CC: Dr. Burnice Logan

## 2014-02-09 ENCOUNTER — Encounter: Payer: Self-pay | Admitting: Neurology

## 2014-07-25 ENCOUNTER — Encounter (HOSPITAL_COMMUNITY): Payer: Self-pay | Admitting: Nurse Practitioner

## 2014-07-25 ENCOUNTER — Emergency Department (HOSPITAL_COMMUNITY): Payer: No Typology Code available for payment source

## 2014-07-25 ENCOUNTER — Emergency Department (HOSPITAL_COMMUNITY)
Admission: EM | Admit: 2014-07-25 | Discharge: 2014-07-25 | Disposition: A | Discharge status: Home / Self Care | Payer: No Typology Code available for payment source | Attending: Emergency Medicine | Admitting: Emergency Medicine

## 2014-07-25 DIAGNOSIS — Z8659 Personal history of other mental and behavioral disorders: Secondary | ICD-10-CM | POA: Insufficient documentation

## 2014-07-25 DIAGNOSIS — Z8719 Personal history of other diseases of the digestive system: Secondary | ICD-10-CM | POA: Insufficient documentation

## 2014-07-25 DIAGNOSIS — Z3202 Encounter for pregnancy test, result negative: Secondary | ICD-10-CM | POA: Insufficient documentation

## 2014-07-25 DIAGNOSIS — R102 Pelvic and perineal pain: Secondary | ICD-10-CM

## 2014-07-25 DIAGNOSIS — N8320 Unspecified ovarian cysts: Secondary | ICD-10-CM | POA: Insufficient documentation

## 2014-07-25 DIAGNOSIS — Z86018 Personal history of other benign neoplasm: Secondary | ICD-10-CM | POA: Insufficient documentation

## 2014-07-25 DIAGNOSIS — N83201 Unspecified ovarian cyst, right side: Secondary | ICD-10-CM

## 2014-07-25 HISTORY — DX: Leiomyoma of uterus, unspecified: D25.9

## 2014-07-25 LAB — COMPREHENSIVE METABOLIC PANEL
ALK PHOS: 40 U/L (ref 38–126)
ALT: 22 U/L (ref 14–54)
ANION GAP: 8 (ref 5–15)
AST: 23 U/L (ref 15–41)
Albumin: 4.3 g/dL (ref 3.5–5.0)
BUN: 10 mg/dL (ref 6–20)
CALCIUM: 8.8 mg/dL — AB (ref 8.9–10.3)
CO2: 22 mmol/L (ref 22–32)
Chloride: 105 mmol/L (ref 101–111)
Creatinine, Ser: 0.58 mg/dL (ref 0.44–1.00)
GLUCOSE: 94 mg/dL (ref 65–99)
Potassium: 3.7 mmol/L (ref 3.5–5.1)
SODIUM: 135 mmol/L (ref 135–145)
Total Bilirubin: 0.9 mg/dL (ref 0.3–1.2)
Total Protein: 7.3 g/dL (ref 6.5–8.1)

## 2014-07-25 LAB — I-STAT CHEM 8, ED
BUN: 11 mg/dL (ref 6–20)
CREATININE: 0.5 mg/dL (ref 0.44–1.00)
Calcium, Ion: 1.2 mmol/L (ref 1.12–1.23)
Chloride: 102 mmol/L (ref 101–111)
Glucose, Bld: 96 mg/dL (ref 65–99)
HCT: 44 % (ref 36.0–46.0)
Hemoglobin: 15 g/dL (ref 12.0–15.0)
POTASSIUM: 3.7 mmol/L (ref 3.5–5.1)
Sodium: 138 mmol/L (ref 135–145)
TCO2: 22 mmol/L (ref 0–100)

## 2014-07-25 LAB — URINALYSIS, ROUTINE W REFLEX MICROSCOPIC
Bilirubin Urine: NEGATIVE
GLUCOSE, UA: NEGATIVE mg/dL
HGB URINE DIPSTICK: NEGATIVE
KETONES UR: NEGATIVE mg/dL
Leukocytes, UA: NEGATIVE
Nitrite: NEGATIVE
Protein, ur: NEGATIVE mg/dL
Specific Gravity, Urine: 1.018 (ref 1.005–1.030)
UROBILINOGEN UA: 0.2 mg/dL (ref 0.0–1.0)
pH: 8 (ref 5.0–8.0)

## 2014-07-25 LAB — POC URINE PREG, ED: PREG TEST UR: NEGATIVE

## 2014-07-25 LAB — CBC WITH DIFFERENTIAL/PLATELET
BASOS ABS: 0 10*3/uL (ref 0.0–0.1)
Basophils Relative: 0 % (ref 0–1)
EOS ABS: 0.1 10*3/uL (ref 0.0–0.7)
EOS PCT: 1 % (ref 0–5)
HCT: 39.8 % (ref 36.0–46.0)
HEMOGLOBIN: 13.7 g/dL (ref 12.0–15.0)
LYMPHS PCT: 41 % (ref 12–46)
Lymphs Abs: 1.9 10*3/uL (ref 0.7–4.0)
MCH: 29 pg (ref 26.0–34.0)
MCHC: 34.4 g/dL (ref 30.0–36.0)
MCV: 84.3 fL (ref 78.0–100.0)
Monocytes Absolute: 0.4 10*3/uL (ref 0.1–1.0)
Monocytes Relative: 8 % (ref 3–12)
NEUTROS ABS: 2.3 10*3/uL (ref 1.7–7.7)
Neutrophils Relative %: 50 % (ref 43–77)
Platelets: 245 10*3/uL (ref 150–400)
RBC: 4.72 MIL/uL (ref 3.87–5.11)
RDW: 12.9 % (ref 11.5–15.5)
WBC: 4.6 10*3/uL (ref 4.0–10.5)

## 2014-07-25 MED ORDER — IBUPROFEN 800 MG PO TABS: 800.0000 mg | ORAL_TABLET | Freq: Three times a day (TID) | ORAL | Status: DC

## 2014-07-25 MED ORDER — ONDANSETRON HCL 4 MG/2ML IJ SOLN
4.0000 mg | Freq: Once | INTRAMUSCULAR | Status: AC
  Administered 2014-07-25: 4 mg via INTRAVENOUS
  Filled 2014-07-25: qty 2

## 2014-07-25 MED ORDER — IBUPROFEN 800 MG PO TABS
800.0000 mg | ORAL_TABLET | Freq: Once | ORAL | Status: AC
  Administered 2014-07-25: 800 mg via ORAL
  Filled 2014-07-25: qty 1

## 2014-07-25 MED ORDER — OXYCODONE-ACETAMINOPHEN 5-325 MG PO TABS: 1.0000 | ORAL_TABLET | Freq: Four times a day (QID) | ORAL | Status: DC | PRN

## 2014-07-25 MED ORDER — HYDROMORPHONE HCL 1 MG/ML IJ SOLN
1.0000 mg | Freq: Once | INTRAMUSCULAR | Status: DC
  Filled 2014-07-25: qty 1

## 2014-07-25 MED ORDER — SODIUM CHLORIDE 0.9 % IV BOLUS (SEPSIS)
1000.0000 mL | Freq: Once | INTRAVENOUS | Status: AC
  Administered 2014-07-25: 1000 mL via INTRAVENOUS

## 2014-07-25 NOTE — ED Notes (Signed)
Dr. Walden at bedside 

## 2014-07-25 NOTE — ED Provider Notes (Signed)
CSN: [id number]     Arrival date & time 07/25/14  1122 History   First MD Initiated Contact with Patient 07/25/14 1134     Chief Complaint  Patient presents with  . Pelvic Pain     (Consider location/radiation/quality/duration/timing/severity/associated sxs/prior Treatment) HPI Comments: Right-sided pain beginning success light. Alleviated after that. This morning had 2 bouts of it, with worsening. This is different pain and she's had before with her fibroids. Pain is sharp and radiating into her right hip. Mild associated nausea but no fever, dysuria, vaginal bleeding.  Patient is a 39 y.o. female presenting with pelvic pain. The history is provided by the patient.  Pelvic Pain This is a new problem. The current episode started yesterday. The problem occurs constantly. The problem has been gradually worsening. Pertinent negatives include no abdominal pain and no shortness of breath. Nothing aggravates the symptoms. Nothing relieves the symptoms.    Past Medical History  Diagnosis Date  . Headache(784.0)   . Anxiety     father recently passed away  . Gallstones   . Uterine fibroid    Past Surgical History  Procedure Laterality Date  . Eye surgery    . Cesarean section    . Laparoscopic tubal ligation  04/21/2011    Procedure: LAPAROSCOPIC TUBAL LIGATION;  Surgeon: Marylynn Pearson, MD;  Location: [facility];  Service: Gynecology;  Laterality: Bilateral;  Bilateral with Filshie Clips   Family History  Problem Relation Age of Onset  . Depression Mother   . Hepatitis Mother     c  . Osteopenia Mother   . Alcohol abuse Father   . COPD Father   . Heart disease Father   . Cirrhosis Father   . Hepatitis Father     c  . Osteopenia Father    History  Substance Use Topics  . Smoking status: Never Smoker   . Smokeless tobacco: Never Used  . Alcohol Use: Yes     Comment: rare   OB History    No data available     Review of Systems  Constitutional: Negative for fever.   Respiratory: Negative for cough and shortness of breath.   Gastrointestinal: Negative for vomiting and abdominal pain.  Genitourinary: Positive for pelvic pain.  All other systems reviewed and are negative.     Allergies  Codeine  Home Medications   Prior to Admission medications   Medication Sig Start Date End Date Taking? Authorizing Provider  valACYclovir (VALTREX) 500 MG tablet TAKE 1 TABLET (500 MG TOTAL) BY MOUTH 2 (TWO) TIMES DAILY AS NEEDED (FEVER BLISTERS ).    Marletta Lor, MD  verapamil (CALAN-SR) 120 MG CR tablet Take 1 tablet at bedtime 02/03/14   Cameron Sprang, MD  zolmitriptan (ZOMIG) 5 MG tablet Take 1 tablet (5 mg total) by mouth as needed for migraine. May take second dose after 2 hours if headache persists. Do not take more than 3 a week Patient not taking: Reported on 02/03/2014 09/20/13   Cameron Sprang, MD   BP 106/62 mmHg  Pulse 65  Temp(Src) 98.2 F (36.8 C) (Oral)  Resp 17  Ht 5\' 1"  (1.549 m)  Wt 112 lb 8 oz (51.03 kg)  BMI 21.27 kg/m2  SpO2 100%  LMP 07/11/2014 Physical Exam  Constitutional: She is oriented to person, place, and time. She appears well-developed and well-nourished. No distress.  HENT:  Head: Normocephalic and atraumatic.  Mouth/Throat: Oropharynx is clear and moist.  Eyes: EOM are normal. Pupils are  equal, round, and reactive to light.  Neck: Normal range of motion. Neck supple.  Cardiovascular: Normal rate and regular rhythm.  Exam reveals no friction rub.   No murmur heard. Pulmonary/Chest: Effort normal and breath sounds normal. No respiratory distress. She has no wheezes. She has no rales.  Abdominal: Soft. She exhibits no distension. There is tenderness (No focal RLQ pain). There is no rebound.  Genitourinary: Cervix exhibits discharge (mild, white). Cervix exhibits no motion tenderness. Right adnexum displays tenderness. Right adnexum displays no mass and no fullness. Left adnexum displays no mass, no tenderness and no  fullness.  Musculoskeletal: Normal range of motion. She exhibits no edema.  Neurological: She is alert and oriented to person, place, and time.  Skin: No rash noted. She is not diaphoretic.  Nursing note and vitals reviewed.   ED Course  Procedures (including critical care time) Labs Review Labs Reviewed  CBC WITH DIFFERENTIAL/PLATELET  COMPREHENSIVE METABOLIC PANEL  URINALYSIS, ROUTINE W REFLEX MICROSCOPIC (NOT AT [facility])  I-STAT CHEM 8, ED  POC URINE PREG, ED    Imaging Review US Transvaginal Non-ob  07/25/2014   CLINICAL DATA:  Right lower quadrant pain  EXAM: TRANSABDOMINAL AND TRANSVAGINAL ULTRASOUND OF PELVIS  DOPPLER ULTRASOUND OF OVARIES  TECHNIQUE: Both transabdominal and transvaginal ultrasound examinations of the pelvis were performed. Transabdominal technique was performed for global imaging of the pelvis including uterus, ovaries, adnexal regions, and pelvic cul-de-sac.  It was necessary to proceed with endovaginal exam following the transabdominal exam to visualize the ovaries. Color and duplex Doppler ultrasound was utilized to evaluate blood flow to the ovaries.  COMPARISON:  None.  FINDINGS: Uterus  Measurements: 6.8 x 3.9 x 6.4 cm. The uterus is retroverted in position. A few small fibroids are identified. Largest of these measures 1.9 cm in greatest dimension.  Endometrium  Thickness: 10 mm.  No focal abnormality visualized.  Right ovary  Measurements: 4.0 x 2.9 x 2.6 cm. An involuting cyst is noted measuring 2.5 cm in greatest dimension. This likely represents a recently ruptured cyst it would correspond with the patient's given clinical symptomatology.  Left ovary  Measurements: 3.4 x 1.4 x 1.3 cm. Normal appearance/no adnexal mass.  Pulsed Doppler evaluation of both ovaries demonstrates normal low-resistance arterial and venous waveforms.  Other findings  Small amount of free pelvic fluid is noted which also corresponds to a recently ruptured cyst.  IMPRESSION: Changes  consistent with a recently ruptured and involuting right ovarian cyst.  Uterine fibroids.   Electronically Signed   By: Inez Catalina M.D.   On: 07/25/2014 13:50   US Pelvis Complete  07/25/2014   CLINICAL DATA:  Right lower quadrant pain  EXAM: TRANSABDOMINAL AND TRANSVAGINAL ULTRASOUND OF PELVIS  DOPPLER ULTRASOUND OF OVARIES  TECHNIQUE: Both transabdominal and transvaginal ultrasound examinations of the pelvis were performed. Transabdominal technique was performed for global imaging of the pelvis including uterus, ovaries, adnexal regions, and pelvic cul-de-sac.  It was necessary to proceed with endovaginal exam following the transabdominal exam to visualize the ovaries. Color and duplex Doppler ultrasound was utilized to evaluate blood flow to the ovaries.  COMPARISON:  None.  FINDINGS: Uterus  Measurements: 6.8 x 3.9 x 6.4 cm. The uterus is retroverted in position. A few small fibroids are identified. Largest of these measures 1.9 cm in greatest dimension.  Endometrium  Thickness: 10 mm.  No focal abnormality visualized.  Right ovary  Measurements: 4.0 x 2.9 x 2.6 cm. An involuting cyst is noted measuring 2.5 cm in  greatest dimension. This likely represents a recently ruptured cyst it would correspond with the patient's given clinical symptomatology.  Left ovary  Measurements: 3.4 x 1.4 x 1.3 cm. Normal appearance/no adnexal mass.  Pulsed Doppler evaluation of both ovaries demonstrates normal low-resistance arterial and venous waveforms.  Other findings  Small amount of free pelvic fluid is noted which also corresponds to a recently ruptured cyst.  IMPRESSION: Changes consistent with a recently ruptured and involuting right ovarian cyst.  Uterine fibroids.   Electronically Signed   By: Inez Catalina M.D.   On: 07/25/2014 13:50   [place] Art/ven Flow Abd Pelv Doppler  07/25/2014   CLINICAL DATA:  Right lower quadrant pain  EXAM: TRANSABDOMINAL AND TRANSVAGINAL ULTRASOUND OF PELVIS  DOPPLER ULTRASOUND OF OVARIES   TECHNIQUE: Both transabdominal and transvaginal ultrasound examinations of the pelvis were performed. Transabdominal technique was performed for global imaging of the pelvis including uterus, ovaries, adnexal regions, and pelvic cul-de-sac.  It was necessary to proceed with endovaginal exam following the transabdominal exam to visualize the ovaries. Color and duplex Doppler ultrasound was utilized to evaluate blood flow to the ovaries.  COMPARISON:  None.  FINDINGS: Uterus  Measurements: 6.8 x 3.9 x 6.4 cm. The uterus is retroverted in position. A few small fibroids are identified. Largest of these measures 1.9 cm in greatest dimension.  Endometrium  Thickness: 10 mm.  No focal abnormality visualized.  Right ovary  Measurements: 4.0 x 2.9 x 2.6 cm. An involuting cyst is noted measuring 2.5 cm in greatest dimension. This likely represents a recently ruptured cyst it would correspond with the patient's given clinical symptomatology.  Left ovary  Measurements: 3.4 x 1.4 x 1.3 cm. Normal appearance/no adnexal mass.  Pulsed Doppler evaluation of both ovaries demonstrates normal low-resistance arterial and venous waveforms.  Other findings  Small amount of free pelvic fluid is noted which also corresponds to a recently ruptured cyst.  IMPRESSION: Changes consistent with a recently ruptured and involuting right ovarian cyst.  Uterine fibroids.   Electronically Signed   By: Inez Catalina M.D.   On: 07/25/2014 13:50     EKG Interpretation None      MDM   Final diagnoses:  Pain in female pelvis    89F here with R sided pelvic pain. Began last night during sex. Alleviated after sex. Began coming on this morning, intermittent, sharp R lower pelvis radiating into her hip. No vaginal bleeding or dysuria. No vomiting, fever. Will do pelvic and obtain pelvic US. Concern for possible torsion.  US shows ruptured cyst, likely the cause of her pain. Stable for discharge.   Evelina Bucy, MD 07/25/14 1622

## 2014-07-25 NOTE — ED Notes (Signed)
Pt reports history of uterine fibroids, last night she had some pelvic pain that resolved. Today she woke up and felt nauseated then began to have severe R pelvic pain radiating into R hip. Denies vomiting, bowel/bladder changes, vaginal discharge or bleeding. A&Ox4, resp e/u

## 2014-07-25 NOTE — Discharge Instructions (Signed)
Ovarian Cyst An ovarian cyst is a fluid-filled sac that forms on an ovary. The ovaries are small organs that produce eggs in women. Various types of cysts can form on the ovaries. Most are not cancerous. Many do not cause problems, and they often go away on their own. Some may cause symptoms and require treatment. Common types of ovarian cysts include:  Functional cysts--These cysts may occur every month during the menstrual cycle. This is normal. The cysts usually go away with the next menstrual cycle if the woman does not get pregnant. Usually, there are no symptoms with a functional cyst.  Endometrioma cysts--These cysts form from the tissue that lines the uterus. They are also called "chocolate cysts" because they become filled with blood that turns brown. This type of cyst can cause pain in the lower abdomen during intercourse and with your menstrual period.  Cystadenoma cysts--This type develops from the cells on the outside of the ovary. These cysts can get very big and cause lower abdomen pain and pain with intercourse. This type of cyst can twist on itself, cut off its blood supply, and cause severe pain. It can also easily rupture and cause a lot of pain.  Dermoid cysts--This type of cyst is sometimes found in both ovaries. These cysts may contain different kinds of body tissue, such as skin, teeth, hair, or cartilage. They usually do not cause symptoms unless they get very big.  Theca lutein cysts--These cysts occur when too much of a certain hormone (human chorionic gonadotropin) is produced and overstimulates the ovaries to produce an egg. This is most common after procedures used to assist with the conception of a baby (in vitro fertilization). CAUSES   Fertility drugs can cause a condition in which multiple large cysts are formed on the ovaries. This is called ovarian hyperstimulation syndrome.  A condition called polycystic ovary syndrome can cause hormonal imbalances that can lead to  nonfunctional ovarian cysts. SIGNS AND SYMPTOMS  Many ovarian cysts do not cause symptoms. If symptoms are present, they may include:  Pelvic pain or pressure.  Pain in the lower abdomen.  Pain during sexual intercourse.  Increasing girth (swelling) of the abdomen.  Abnormal menstrual periods.  Increasing pain with menstrual periods.  Stopping having menstrual periods without being pregnant. DIAGNOSIS  These cysts are commonly found during a routine or annual pelvic exam. Tests may be ordered to find out more about the cyst. These tests may include:  Ultrasound.  X-ray of the pelvis.  CT scan.  MRI.  Blood tests. TREATMENT  Many ovarian cysts go away on their own without treatment. Your health care provider may want to check your cyst regularly for 2-3 months to see if it changes. For women in menopause, it is particularly important to monitor a cyst closely because of the higher rate of ovarian cancer in menopausal women. When treatment is needed, it may include any of the following:  A procedure to drain the cyst (aspiration). This may be done using a long needle and ultrasound. It can also be done through a laparoscopic procedure. This involves using a thin, lighted tube with a tiny camera on the end (laparoscope) inserted through a small incision.  Surgery to remove the whole cyst. This may be done using laparoscopic surgery or an open surgery involving a larger incision in the lower abdomen.  Hormone treatment or birth control pills. These methods are sometimes used to help dissolve a cyst. HOME CARE INSTRUCTIONS   Only take over-the-counter   or prescription medicines as directed by your health care provider.  Follow up with your health care provider as directed.  Get regular pelvic exams and Pap tests. SEEK MEDICAL CARE IF:   Your periods are late, irregular, or painful, or they stop.  Your pelvic pain or abdominal pain does not go away.  Your abdomen becomes  larger or swollen.  You have pressure on your bladder or trouble emptying your bladder completely.  You have pain during sexual intercourse.  You have feelings of fullness, pressure, or discomfort in your stomach.  You lose weight for no apparent reason.  You feel generally ill.  You become constipated.  You lose your appetite.  You develop acne.  You have an increase in body and facial hair.  You are gaining weight, without changing your exercise and eating habits.  You think you are pregnant. SEEK IMMEDIATE MEDICAL CARE IF:   You have increasing abdominal pain.  You feel sick to your stomach (nauseous), and you throw up (vomit).  You develop a fever that comes on suddenly.  You have abdominal pain during a bowel movement.  Your menstrual periods become heavier than usual. MAKE SURE YOU:  Understand these instructions.  Will watch your condition.  Will get help right away if you are not doing well or get worse. Document Released: 01/13/2005 Document Revised: 01/18/2013 Document Reviewed: 09/20/2012 Exit[employer] Patient Information 2015 [employer]. This information is not intended to replace advice given to you by your health care provider. Make sure you discuss any questions you have with your health care provider.  

## 2014-08-04 ENCOUNTER — Ambulatory Visit: Payer: No Typology Code available for payment source | Admitting: Neurology

## 2014-11-08 ENCOUNTER — Emergency Department (HOSPITAL_COMMUNITY)
Admission: EM | Admit: 2014-11-08 | Discharge: 2014-11-08 | Disposition: A | Discharge status: Home / Self Care | Payer: Self-pay | Attending: Emergency Medicine | Admitting: Emergency Medicine

## 2014-11-08 ENCOUNTER — Encounter (HOSPITAL_COMMUNITY): Payer: Self-pay | Admitting: *Deleted

## 2014-11-08 ENCOUNTER — Emergency Department (HOSPITAL_COMMUNITY): Payer: No Typology Code available for payment source

## 2014-11-08 DIAGNOSIS — Z8742 Personal history of other diseases of the female genital tract: Secondary | ICD-10-CM | POA: Insufficient documentation

## 2014-11-08 DIAGNOSIS — Z791 Long term (current) use of non-steroidal anti-inflammatories (NSAID): Secondary | ICD-10-CM | POA: Insufficient documentation

## 2014-11-08 DIAGNOSIS — R109 Unspecified abdominal pain: Secondary | ICD-10-CM | POA: Insufficient documentation

## 2014-11-08 DIAGNOSIS — R51 Headache: Secondary | ICD-10-CM | POA: Insufficient documentation

## 2014-11-08 DIAGNOSIS — Z79899 Other long term (current) drug therapy: Secondary | ICD-10-CM | POA: Insufficient documentation

## 2014-11-08 DIAGNOSIS — R079 Chest pain, unspecified: Secondary | ICD-10-CM | POA: Insufficient documentation

## 2014-11-08 DIAGNOSIS — Z8719 Personal history of other diseases of the digestive system: Secondary | ICD-10-CM | POA: Insufficient documentation

## 2014-11-08 DIAGNOSIS — Z8659 Personal history of other mental and behavioral disorders: Secondary | ICD-10-CM | POA: Insufficient documentation

## 2014-11-08 LAB — CBC
HCT: 38.3 % (ref 36.0–46.0)
Hemoglobin: 13.2 g/dL (ref 12.0–15.0)
MCH: 28.9 pg (ref 26.0–34.0)
MCHC: 34.5 g/dL (ref 30.0–36.0)
MCV: 84 fL (ref 78.0–100.0)
Platelets: 222 10*3/uL (ref 150–400)
RBC: 4.56 MIL/uL (ref 3.87–5.11)
RDW: 13 % (ref 11.5–15.5)
WBC: 5.2 10*3/uL (ref 4.0–10.5)

## 2014-11-08 LAB — BASIC METABOLIC PANEL
Anion gap: 9 (ref 5–15)
BUN: 9 mg/dL (ref 6–20)
CO2: 22 mmol/L (ref 22–32)
CREATININE: 0.62 mg/dL (ref 0.44–1.00)
Calcium: 8.9 mg/dL (ref 8.9–10.3)
Chloride: 102 mmol/L (ref 101–111)
Glucose, Bld: 86 mg/dL (ref 65–99)
Potassium: 3.9 mmol/L (ref 3.5–5.1)
SODIUM: 133 mmol/L — AB (ref 135–145)

## 2014-11-08 LAB — I-STAT TROPONIN, ED: Troponin i, poc: 0 ng/mL (ref 0.00–0.08)

## 2014-11-08 MED ORDER — OMEPRAZOLE 20 MG PO CPDR: 20.0000 mg | DELAYED_RELEASE_CAPSULE | Freq: Every day | ORAL | Status: DC

## 2014-11-08 NOTE — ED Notes (Signed)
States sternum has been hurting since Thursday.  Pt states at then started having sternal and rib pain since 0330 and reports stomach burning.  No nausea, but with pain has some sob.   No injury and no respiratory issues.  No long travel.  No calf pain

## 2014-11-08 NOTE — Discharge Instructions (Signed)

## 2014-11-08 NOTE — ED Provider Notes (Signed)
CSN: [id number]     Arrival date & time 11/08/14  8341 History   First MD Initiated Contact with Patient 11/08/14 [phone]     Chief Complaint  Patient presents with  . Chest Pain     (Consider location/radiation/quality/duration/timing/severity/associated sxs/prior Treatment) Patient is a 39 y.o. female presenting with chest pain. The history is provided by the patient.  Chest Pain Associated symptoms: abdominal pain   Associated symptoms: no back pain, no headache, no nausea, no numbness, no shortness of breath, not vomiting and no weakness    patient has had pain in her chest over the last several days. Worse with movement. No trauma. No difficult breathing but some pain with breathing. Pain also worse with palpation. No fevers or chills. Not worse with exertion now has had some abdominal pain also. States last night the pain is in her chest and also her abdomen. No nausea vomiting. She's had a previous cholecystectomy. No diaphoresis. No swelling in her legs.  Past Medical History  Diagnosis Date  . Headache(784.0)   . Anxiety     father recently passed away  . Gallstones   . Uterine fibroid    Past Surgical History  Procedure Laterality Date  . Eye surgery    . Cesarean section    . Laparoscopic tubal ligation  04/21/2011    Procedure: LAPAROSCOPIC TUBAL LIGATION;  Surgeon: Marylynn Pearson, MD;  Location: [facility];  Service: Gynecology;  Laterality: Bilateral;  Bilateral with Filshie Clips  . Cholecystectomy     Family History  Problem Relation Age of Onset  . Depression Mother   . Hepatitis Mother     c  . Osteopenia Mother   . Alcohol abuse Father   . COPD Father   . Heart disease Father   . Cirrhosis Father   . Hepatitis Father     c  . Osteopenia Father    Social History  Substance Use Topics  . Smoking status: Never Smoker   . Smokeless tobacco: Never Used  . Alcohol Use: Yes     Comment: rare   OB History    No data available     Review of Systems   Constitutional: Negative for activity change and appetite change.  Eyes: Negative for pain.  Respiratory: Negative for chest tightness and shortness of breath.   Cardiovascular: Positive for chest pain. Negative for leg swelling.  Gastrointestinal: Positive for abdominal pain. Negative for nausea, vomiting and diarrhea.  Genitourinary: Negative for flank pain.  Musculoskeletal: Negative for back pain and neck stiffness.  Skin: Negative for rash.  Neurological: Negative for weakness, numbness and headaches.  Psychiatric/Behavioral: Negative for behavioral problems.      Allergies  Codeine and Vicodin  Home Medications   Prior to Admission medications   Medication Sig Start Date End Date Taking? Authorizing Provider  ibuprofen (ADVIL,MOTRIN) 200 MG tablet Take 600 mg by mouth every 8 (eight) hours as needed for mild pain or moderate pain.    Historical Provider, MD  ibuprofen (ADVIL,MOTRIN) 800 MG tablet Take 1 tablet (800 mg total) by mouth 3 (three) times daily. Patient not taking: Reported on 11/08/2014 07/25/14   Evelina Bucy, MD  omeprazole (PRILOSEC) 20 MG capsule Take 1 capsule (20 mg total) by mouth daily. 11/08/14   Davonna Belling, MD  verapamil (CALAN-SR) 120 MG CR tablet Take 1 tablet at bedtime Patient not taking: Reported on 07/25/2014 02/03/14   Cameron Sprang, MD  zolmitriptan (ZOMIG) 5 MG tablet Take 1 tablet (5  mg total) by mouth as needed for migraine. May take second dose after 2 hours if headache persists. Do not take more than 3 a week Patient not taking: Reported on 02/03/2014 09/20/13   Cameron Sprang, MD   BP 99/55 mmHg  Pulse 56  Temp(Src) 98.4 F (36.9 C) (Oral)  Resp 16  SpO2 100%  LMP 10/20/2014 Physical Exam  Constitutional: She is oriented to person, place, and time. She appears well-developed and well-nourished.  HENT:  Head: Normocephalic and atraumatic.  Eyes: Pupils are equal, round, and reactive to light.  Neck: Normal range of motion. Neck  supple.  Cardiovascular: Normal rate, regular rhythm and normal heart sounds.   No murmur heard. Pulmonary/Chest: Effort normal and breath sounds normal. No respiratory distress. She has no wheezes. She has no rales. She exhibits tenderness.  Tenderness  on anterior lower sternum. No rash. No crepitance or deformity.   Abdominal: Soft. Bowel sounds are normal. She exhibits no distension. There is no tenderness. There is no rebound and no guarding.  Musculoskeletal: Normal range of motion.  Neurological: She is alert and oriented to person, place, and time. No cranial nerve deficit.  Skin: Skin is warm and dry.  Psychiatric: She has a normal mood and affect. Her speech is normal.  Nursing note and vitals reviewed.   ED Course  Procedures (including critical care time) Labs Review Labs Reviewed  BASIC METABOLIC PANEL - Abnormal; Notable for the following:    Sodium 133 (*)    All other components within normal limits  CBC  I-STAT TROPOININ, ED    Imaging Review No results found. I have personally reviewed and evaluated these images and lab results as part of my medical decision-making.   EKG Interpretation   Date/Time:  Wednesday November 08 2014 09:35:30 EDT Ventricular Rate:  70 PR Interval:  169 QRS Duration: 110 QT Interval:  385 QTC Calculation: 415 R Axis:   127 Text Interpretation:  Sinus rhythm Right axis deviation Confirmed by  Bernadine Melecio  MD, Channing Yeager [id number]) on 11/08/2014 10:50:08 AM      MDM   Final diagnoses:  Chest pain, unspecified chest pain type   Patient with chest pain. Doubt cardiac cause. Doubt severe intra-abdominal cause. May be chest wall pain. Will discharge home.    Davonna Belling, MD 11/11/14 1440

## 2015-07-13 ENCOUNTER — Ambulatory Visit: Payer: No Typology Code available for payment source | Admitting: Family Medicine

## 2015-07-19 ENCOUNTER — Ambulatory Visit: Payer: No Typology Code available for payment source | Admitting: Family Medicine

## 2016-02-07 ENCOUNTER — Encounter (HOSPITAL_COMMUNITY): Payer: Self-pay

## 2016-02-07 ENCOUNTER — Emergency Department (HOSPITAL_COMMUNITY): Payer: Self-pay

## 2016-02-07 ENCOUNTER — Emergency Department (HOSPITAL_COMMUNITY)
Admission: EM | Admit: 2016-02-07 | Discharge: 2016-02-07 | Disposition: A | Discharge status: Home / Self Care | Payer: Self-pay | Attending: Emergency Medicine | Admitting: Emergency Medicine

## 2016-02-07 DIAGNOSIS — R102 Pelvic and perineal pain: Secondary | ICD-10-CM | POA: Insufficient documentation

## 2016-02-07 DIAGNOSIS — N83519 Torsion of ovary and ovarian pedicle, unspecified side: Secondary | ICD-10-CM

## 2016-02-07 DIAGNOSIS — Z79899 Other long term (current) drug therapy: Secondary | ICD-10-CM | POA: Insufficient documentation

## 2016-02-07 DIAGNOSIS — R1031 Right lower quadrant pain: Secondary | ICD-10-CM | POA: Insufficient documentation

## 2016-02-07 LAB — COMPREHENSIVE METABOLIC PANEL
ALK PHOS: 40 U/L (ref 38–126)
ALT: 18 U/L (ref 14–54)
AST: 22 U/L (ref 15–41)
Albumin: 4.2 g/dL (ref 3.5–5.0)
Anion gap: 8 (ref 5–15)
BUN: 8 mg/dL (ref 6–20)
CALCIUM: 9.2 mg/dL (ref 8.9–10.3)
CHLORIDE: 104 mmol/L (ref 101–111)
CO2: 25 mmol/L (ref 22–32)
CREATININE: 0.49 mg/dL (ref 0.44–1.00)
GFR calc Af Amer: >60 mL/min (ref >60)
Glucose, Bld: 105 mg/dL — ABNORMAL HIGH (ref 65–99)
Potassium: 4.1 mmol/L (ref 3.5–5.1)
Sodium: 137 mmol/L (ref 135–145)
Total Bilirubin: 0.7 mg/dL (ref 0.3–1.2)
Total Protein: 7 g/dL (ref 6.5–8.1)

## 2016-02-07 LAB — CBC
HCT: 39.1 % (ref 36.0–46.0)
Hemoglobin: 13.4 g/dL (ref 12.0–15.0)
MCH: 28.6 pg (ref 26.0–34.0)
MCHC: 34.3 g/dL (ref 30.0–36.0)
MCV: 83.4 fL (ref 78.0–100.0)
PLATELETS: 279 10*3/uL (ref 150–400)
RBC: 4.69 MIL/uL (ref 3.87–5.11)
RDW: 13.4 % (ref 11.5–15.5)
WBC: 4.9 10*3/uL (ref 4.0–10.5)

## 2016-02-07 LAB — URINALYSIS, ROUTINE W REFLEX MICROSCOPIC
Bacteria, UA: NONE SEEN
Bilirubin Urine: NEGATIVE
Glucose, UA: NEGATIVE mg/dL
Ketones, ur: NEGATIVE mg/dL
Leukocytes, UA: NEGATIVE
Nitrite: NEGATIVE
PH: 7 (ref 5.0–8.0)
Protein, ur: NEGATIVE mg/dL
SPECIFIC GRAVITY, URINE: 1.017 (ref 1.005–1.030)

## 2016-02-07 LAB — PREGNANCY, URINE: PREG TEST UR: NEGATIVE

## 2016-02-07 LAB — WET PREP, GENITAL
CLUE CELLS WET PREP: NONE SEEN
SPERM: NONE SEEN
TRICH WET PREP: NONE SEEN
Yeast Wet Prep HPF POC: NONE SEEN

## 2016-02-07 LAB — LIPASE, BLOOD: LIPASE: 25 U/L (ref 11–51)

## 2016-02-07 MED ORDER — ONDANSETRON 4 MG PO TBDP: 4.0000 mg | ORAL_TABLET | Freq: Three times a day (TID) | ORAL | 0 refills | Status: DC | PRN

## 2016-02-07 NOTE — Discharge Instructions (Signed)
Please seek immediate medical attention if you have severe pain again. Your labs were normal and your imaging was also normal (no signs of ruptured cyst or ovarian torsion).

## 2016-02-07 NOTE — ED Notes (Signed)
Pt reports pain was tolerable and did not want any pain medicine at this time.

## 2016-02-07 NOTE — ED Provider Notes (Signed)
[facility] DEPT Provider Note   CSN: [id number] Arrival date & time: 02/07/16  0900     History   Chief Complaint Chief Complaint  Patient presents with  . Abdominal Pain    HPI Janet Myers is a 41 y.o. female with PMH of gall stones s/p cholecystectomy, uterine fibroids, hx of rupture ovarian cyst who presents with acute right pelvic pain.   Patient reports of acute pain in the right pelvis that started around 2:30 AM today. The pain radiates up to her right hip. She reports of some nausea but no vomiting since the pain started; also reports of feeling hot. It is a constant pain that increases in intensity at times. Worse with any type of movement. Denies vaginal discharge or pain with intercourse. Last intercourse was two nights ago. She married and has not other partners. Reports that she does not think husband has other partners but "there was an incident a few years ago".  No abnormal vaginal bleeding. No diarrhea or constipation. No dysuria or urinary frequency. Has history of ruptured ovarian cyst on the right; this episode of pain is worse than before. Has a tubal ligation.    Abdominal Pain   Associated symptoms include nausea. Pertinent negatives include diarrhea, constipation, dysuria, frequency and hematuria.     Past Medical History:  Diagnosis Date  . Anxiety    father recently passed away  . Gallstones   . Headache(784.0)   . Uterine fibroid     Patient Active Problem List   Diagnosis Date Noted  . Cluster headaches 05/30/2013  . B12 deficiency 07/22/2012  . Anemia, iron deficiency 07/22/2012  . Gallstones 11/11/2011    Past Surgical History:  Procedure Laterality Date  . CESAREAN SECTION    . CHOLECYSTECTOMY    . EYE SURGERY    . LAPAROSCOPIC TUBAL LIGATION  04/21/2011   Procedure: LAPAROSCOPIC TUBAL LIGATION;  Surgeon: Marylynn Pearson, MD;  Location: [facility];  Service: Gynecology;  Laterality: Bilateral;  Bilateral with Filshie Clips    OB  History    No data available       Home Medications    Prior to Admission medications   Medication Sig Start Date End Date Taking? Authorizing Provider  ibuprofen (ADVIL,MOTRIN) 200 MG tablet Take 600 mg by mouth every 8 (eight) hours as needed for mild pain or moderate pain.    Historical Provider, MD  ibuprofen (ADVIL,MOTRIN) 800 MG tablet Take 1 tablet (800 mg total) by mouth 3 (three) times daily. Patient not taking: Reported on 11/08/2014 07/25/14   Evelina Bucy, MD  omeprazole (PRILOSEC) 20 MG capsule Take 1 capsule (20 mg total) by mouth daily. Patient not taking: Reported on 02/07/2016 11/08/14   Davonna Belling, MD  ondansetron (ZOFRAN-ODT) 4 MG disintegrating tablet Take 1 tablet (4 mg total) by mouth every 8 (eight) hours as needed for nausea or vomiting. 02/07/16   Smiley Houseman, MD  verapamil (CALAN-SR) 120 MG CR tablet Take 1 tablet at bedtime Patient not taking: Reported on 07/25/2014 02/03/14   Cameron Sprang, MD  zolmitriptan (ZOMIG) 5 MG tablet Take 1 tablet (5 mg total) by mouth as needed for migraine. May take second dose after 2 hours if headache persists. Do not take more than 3 a week Patient not taking: Reported on 02/03/2014 09/20/13   Cameron Sprang, MD    Family History Family History  Problem Relation Age of Onset  . Depression Mother   . Hepatitis Mother  c  . Osteopenia Mother   . Alcohol abuse Father   . COPD Father   . Heart disease Father   . Cirrhosis Father   . Hepatitis Father     c  . Osteopenia Father     Social History Social History  Substance Use Topics  . Smoking status: Never Smoker  . Smokeless tobacco: Never Used  . Alcohol use Yes     Comment: rare     Allergies   Codeine and Vicodin [hydrocodone-acetaminophen]   Review of Systems Review of Systems  Constitutional:       "felt hot" this morning   Gastrointestinal: Positive for abdominal pain and nausea. Negative for constipation and diarrhea.  Genitourinary:  Negative for dyspareunia, dysuria, flank pain, frequency, hematuria, vaginal bleeding and vaginal discharge.     Physical Exam Updated Vital Signs BP 105/63   Pulse 62   Temp 98.2 F (36.8 C) (Oral)   Resp 20   LMP 01/30/2016 (Within Days)   SpO2 99%   Physical Exam  Constitutional: She appears well-developed and well-nourished. No distress.  Cardiovascular: Normal rate and regular rhythm.  Exam reveals no gallop and no friction rub.   No murmur heard. Pulmonary/Chest: Effort normal and breath sounds normal.  Abdominal: Soft. She exhibits no distension. There is no rebound and no guarding.  Currently mild tenderness to palpation of the right lower quadrant/pelvic area.   Genitourinary: Vagina normal. No vaginal discharge found.  Genitourinary Comments: Female genitalia: normal external genitalia, vulva, vagina, cervix. Tenderness of the right adnexa without masses palpated.    Skin: She is not diaphoretic.     ED Treatments / Results  Labs (all labs ordered are listed, but only abnormal results are displayed) Labs Reviewed  WET PREP, GENITAL - Abnormal; Notable for the following:       Result Value   WBC, Wet Prep HPF POC MODERATE (*)    All other components within normal limits  COMPREHENSIVE METABOLIC PANEL - Abnormal; Notable for the following:    Glucose, Bld 105 (*)    All other components within normal limits  URINALYSIS, ROUTINE W REFLEX MICROSCOPIC - Abnormal; Notable for the following:    Hgb urine dipstick SMALL (*)    Squamous Epithelial / LPF 0-5 (*)    All other components within normal limits  LIPASE, BLOOD  CBC  PREGNANCY, URINE  POC URINE PREG, ED  GC/CHLAMYDIA PROBE AMP ([place]) NOT AT [facility]    EKG  EKG Interpretation None       Radiology US Transvaginal Non-ob  Result Date: 02/07/2016 CLINICAL DATA:  Right lower quadrant pain radiating into the back since 2:30 a.m. last night. EXAM: TRANSABDOMINAL AND TRANSVAGINAL ULTRASOUND OF PELVIS  DOPPLER ULTRASOUND OF OVARIES TECHNIQUE: Both transabdominal and transvaginal ultrasound examinations of the pelvis were performed. Transabdominal technique was performed for global imaging of the pelvis including uterus, ovaries, adnexal regions, and pelvic cul-de-sac. It was necessary to proceed with endovaginal exam following the transabdominal exam to visualize the adnexa. Color and duplex Doppler ultrasound was utilized to evaluate blood flow to the ovaries. COMPARISON:  Pelvic ultrasound 07/24/2013. FINDINGS: Uterus Measurements: 8.1 x 4.7 x 5.6 cm. Three small fibroids are identified. The largest measures 2 cm in diameter. The uterus is retroverted. Endometrium Thickness: 0.8 cm.  No focal abnormality visualized. Right ovary Measurements: 2.6 x 1.6 x 2.3 cm. Normal appearance/no adnexal mass. Left ovary Measurements: 2.9 x 3.7 x 2.4 cm. Normal appearance/no adnexal mass. Pulsed Doppler evaluation  of both ovaries demonstrates normal low-resistance arterial and venous waveforms. Other findings No abnormal free fluid. IMPRESSION: No acute abnormality.  Negative for ovarian torsion. 3 small uterine fibroids measuring up to 2.0 cm are seen. Electronically Signed   By: Inge Rise M.D.   On: 02/07/2016 11:09   US Pelvis Complete  Result Date: 02/07/2016 CLINICAL DATA:  Right lower quadrant pain radiating into the back since 2:30 a.m. last night. EXAM: TRANSABDOMINAL AND TRANSVAGINAL ULTRASOUND OF PELVIS DOPPLER ULTRASOUND OF OVARIES TECHNIQUE: Both transabdominal and transvaginal ultrasound examinations of the pelvis were performed. Transabdominal technique was performed for global imaging of the pelvis including uterus, ovaries, adnexal regions, and pelvic cul-de-sac. It was necessary to proceed with endovaginal exam following the transabdominal exam to visualize the adnexa. Color and duplex Doppler ultrasound was utilized to evaluate blood flow to the ovaries. COMPARISON:  Pelvic ultrasound 07/24/2013.  FINDINGS: Uterus Measurements: 8.1 x 4.7 x 5.6 cm. Three small fibroids are identified. The largest measures 2 cm in diameter. The uterus is retroverted. Endometrium Thickness: 0.8 cm.  No focal abnormality visualized. Right ovary Measurements: 2.6 x 1.6 x 2.3 cm. Normal appearance/no adnexal mass. Left ovary Measurements: 2.9 x 3.7 x 2.4 cm. Normal appearance/no adnexal mass. Pulsed Doppler evaluation of both ovaries demonstrates normal low-resistance arterial and venous waveforms. Other findings No abnormal free fluid. IMPRESSION: No acute abnormality.  Negative for ovarian torsion. 3 small uterine fibroids measuring up to 2.0 cm are seen. Electronically Signed   By: Inge Rise M.D.   On: 02/07/2016 11:09   [place] Art/ven Flow Abd Pelv Doppler  Result Date: 02/07/2016 CLINICAL DATA:  Right lower quadrant pain radiating into the back since 2:30 a.m. last night. EXAM: TRANSABDOMINAL AND TRANSVAGINAL ULTRASOUND OF PELVIS DOPPLER ULTRASOUND OF OVARIES TECHNIQUE: Both transabdominal and transvaginal ultrasound examinations of the pelvis were performed. Transabdominal technique was performed for global imaging of the pelvis including uterus, ovaries, adnexal regions, and pelvic cul-de-sac. It was necessary to proceed with endovaginal exam following the transabdominal exam to visualize the adnexa. Color and duplex Doppler ultrasound was utilized to evaluate blood flow to the ovaries. COMPARISON:  Pelvic ultrasound 07/24/2013. FINDINGS: Uterus Measurements: 8.1 x 4.7 x 5.6 cm. Three small fibroids are identified. The largest measures 2 cm in diameter. The uterus is retroverted. Endometrium Thickness: 0.8 cm.  No focal abnormality visualized. Right ovary Measurements: 2.6 x 1.6 x 2.3 cm. Normal appearance/no adnexal mass. Left ovary Measurements: 2.9 x 3.7 x 2.4 cm. Normal appearance/no adnexal mass. Pulsed Doppler evaluation of both ovaries demonstrates normal low-resistance arterial and venous waveforms. Other  findings No abnormal free fluid. IMPRESSION: No acute abnormality.  Negative for ovarian torsion. 3 small uterine fibroids measuring up to 2.0 cm are seen. Electronically Signed   By: Inge Rise M.D.   On: 02/07/2016 11:09    Procedures Procedures (including critical care time)  Medications Ordered in ED Medications - No data to display   Initial Impression / Assessment and Plan / ED Course  I have reviewed the triage vital signs and the nursing notes.  Pertinent labs & imaging results that were available during my care of the patient were reviewed by me and considered in my medical decision making (see chart for details).  Clinical Course    41 yo female with history of uterine fibroids and history of ovarian cyst rupture presents with acute onset right pelvic pain. Afebrile vitals stable. Abdomen is soft without guarding or rebound but with mild TTP of the right  lower quadrant/pelvic region. Pelvic exam without sigificant discharge, no cervical motion tenderness but tenderness of the right adnexa. Differentials include ruptured ovarian cyst, ovarian torsion, renal stone. Less likely appendicitis due to acute onset of symptoms, no leukocytosis, and afebrile. Additionally no leukocytosis. CMP unremarkable. Lipase 25. UA without infection but with small hgb (0-5 rbc). Urine pregnancy negative.  Wet prep/Gc/Chlamydia ordered per patient request. Wet prep only with moderate bacteria. Will obtain ultrasound and dopplers to rule out ovarian torsion. Ultrasound and dopplers negative for ovarian torsion and only shows 3 small uterine fibroids.   Patient declines any pain medication.   Discussed results with patient. Less likely this is appendicitis. Discussed imaging of the abdomen/CT renal stone study versus monitoring. Patient opted to monitor symptoms for now. Discussed return precautions.   Discussed with Dr. Regenia Skeeter  Final Clinical Impressions(s) / ED Diagnoses   Final diagnoses:    right pelvic pain (NO ovarian torsion)    New Prescriptions New Prescriptions   ONDANSETRON (ZOFRAN-ODT) 4 MG DISINTEGRATING TABLET    Take 1 tablet (4 mg total) by mouth every 8 (eight) hours as needed for nausea or vomiting.     Smiley Houseman, MD 02/07/16 [place], MD 02/12/16 [phone]

## 2016-02-07 NOTE — ED Notes (Signed)
Papers reviewed with patient and family member and they verbalize understanding. IV removed and patient went to the bathroom on the way out

## 2016-02-07 NOTE — ED Triage Notes (Addendum)
Pt reports excruciating right side abd pain with radiation to her back. She reports it began around 0230. She has felt nauseous without vomiting. Hx of ovarian cyst rupture.

## 2016-02-08 LAB — GC/CHLAMYDIA PROBE AMP (~~LOC~~) NOT AT ARMC
Chlamydia: NEGATIVE
Neisseria Gonorrhea: NEGATIVE

## 2016-10-13 ENCOUNTER — Encounter (HOSPITAL_COMMUNITY): Payer: Self-pay | Admitting: *Deleted

## 2016-10-13 ENCOUNTER — Emergency Department (HOSPITAL_COMMUNITY): Payer: Self-pay

## 2016-10-13 ENCOUNTER — Emergency Department (HOSPITAL_COMMUNITY)
Admission: EM | Admit: 2016-10-13 | Discharge: 2016-10-13 | Disposition: A | Discharge status: Home / Self Care | Payer: Self-pay | Attending: Emergency Medicine | Admitting: Emergency Medicine

## 2016-10-13 DIAGNOSIS — N200 Calculus of kidney: Secondary | ICD-10-CM | POA: Insufficient documentation

## 2016-10-13 LAB — URINALYSIS, ROUTINE W REFLEX MICROSCOPIC
Glucose, UA: NEGATIVE mg/dL
Ketones, ur: NEGATIVE mg/dL
Nitrite: NEGATIVE
Protein, ur: 30 mg/dL — AB
pH: 5.5 (ref 5.0–8.0)

## 2016-10-13 LAB — CBC
HCT: 41 % (ref 36.0–46.0)
Hemoglobin: 13.9 g/dL (ref 12.0–15.0)
MCH: 28.3 pg (ref 26.0–34.0)
MCHC: 33.9 g/dL (ref 30.0–36.0)
MCV: 83.5 fL (ref 78.0–100.0)
Platelets: 289 10*3/uL (ref 150–400)
RBC: 4.91 MIL/uL (ref 3.87–5.11)
RDW: 13.5 % (ref 11.5–15.5)
WBC: 8.2 10*3/uL (ref 4.0–10.5)

## 2016-10-13 LAB — BASIC METABOLIC PANEL
Anion gap: 8 (ref 5–15)
BUN: 10 mg/dL (ref 6–20)
CHLORIDE: 106 mmol/L (ref 101–111)
CO2: 23 mmol/L (ref 22–32)
CREATININE: 0.66 mg/dL (ref 0.44–1.00)
Calcium: 9.2 mg/dL (ref 8.9–10.3)
Glucose, Bld: 129 mg/dL — ABNORMAL HIGH (ref 65–99)
POTASSIUM: 3.7 mmol/L (ref 3.5–5.1)
SODIUM: 137 mmol/L (ref 135–145)

## 2016-10-13 LAB — I-STAT BETA HCG BLOOD, ED (MC, WL, AP ONLY): I-stat hCG, quantitative: <5 m[IU]/mL (ref <5)

## 2016-10-13 LAB — URINALYSIS, MICROSCOPIC (REFLEX)

## 2016-10-13 MED ORDER — ONDANSETRON 4 MG PO TBDP: 4.0000 mg | ORAL_TABLET | Freq: Three times a day (TID) | ORAL | 0 refills | Status: DC | PRN

## 2016-10-13 MED ORDER — TAMSULOSIN HCL 0.4 MG PO CAPS: 0.4000 mg | ORAL_CAPSULE | Freq: Every day | ORAL | 0 refills | Status: AC

## 2016-10-13 MED ORDER — KETOROLAC TROMETHAMINE 30 MG/ML IJ SOLN
30.0000 mg | Freq: Once | INTRAMUSCULAR | Status: AC
  Administered 2016-10-13: 30 mg via INTRAVENOUS
  Filled 2016-10-13: qty 1

## 2016-10-13 MED ORDER — SODIUM CHLORIDE 0.9 % IV BOLUS (SEPSIS)
1000.0000 mL | Freq: Once | INTRAVENOUS | Status: AC
  Administered 2016-10-13: 1000 mL via INTRAVENOUS

## 2016-10-13 MED ORDER — MORPHINE SULFATE 15 MG PO TABS: 15.0000 mg | ORAL_TABLET | Freq: Four times a day (QID) | ORAL | 0 refills | Status: DC | PRN

## 2016-10-13 MED ORDER — ONDANSETRON HCL 4 MG/2ML IJ SOLN
4.0000 mg | Freq: Once | INTRAMUSCULAR | Status: AC
  Administered 2016-10-13: 4 mg via INTRAVENOUS
  Filled 2016-10-13: qty 2

## 2016-10-13 NOTE — ED Notes (Signed)
Got patient undress into a gown patient is resting with family at beside

## 2016-10-13 NOTE — ED Provider Notes (Signed)
[facility] DEPT Provider Note   CSN: [id number] Arrival date & time: 10/13/16  0859     History   Chief Complaint Chief Complaint  Patient presents with  . Urinary Retention  . Abdominal Pain    HPI Janet Myers is a 41 y.o. female with history of gallstones, uterine fibroids who presents today with chief complaint acute onset, progressively improving abdominal pain. She states that she awoke at around 4 AM this morning with severe burning pain" in my urethra", and she thought she was beginning to develop a UTI. She endorses dysuria, urgency, and frequency but denies hematuria. She states that she then went to take a hot bath which helped alleviate this pain. Shortly thereafter, she began developing severe left lower quadrant pain which radiated to the left flank, as well as multiple episodes of nonbloody but bilious emesis. She states that her abdominal pain somewhat was alleviated by vomiting. Unsure of what aggravated her pain. She has not tried anything for her symptoms. Endorses subjective fevers and chills, diaphoresis. Her pain suddenly improved approximately 10 minutes prior to my assessment. Last oral intake includes hot dogs and sorbitol pizza. No one at home has similar symptoms. She is sexually active with her husband only and denies any vaginal complaints including abnormal bleeding, discharge, itching, or lesions. Denies diarrhea, melena, hematochezia.states this pain is unlike anything she has experienced in the past. The history is provided by the patient.    Past Medical History:  Diagnosis Date  . Anxiety    father recently passed away  . Gallstones   . Headache(784.0)   . Uterine fibroid     Patient Active Problem List   Diagnosis Date Noted  . Cluster headaches 05/30/2013  . B12 deficiency 07/22/2012  . Anemia, iron deficiency 07/22/2012  . Gallstones 11/11/2011    Past Surgical History:  Procedure Laterality Date  . CESAREAN SECTION    .  CHOLECYSTECTOMY    . EYE SURGERY    . LAPAROSCOPIC TUBAL LIGATION  04/21/2011   Procedure: LAPAROSCOPIC TUBAL LIGATION;  Surgeon: Marylynn Pearson, MD;  Location: [facility];  Service: Gynecology;  Laterality: Bilateral;  Bilateral with Filshie Clips    OB History    No data available       Home Medications    Prior to Admission medications   Medication Sig Start Date End Date Taking? Authorizing Provider  ibuprofen (ADVIL,MOTRIN) 200 MG tablet Take 800 mg by mouth every 8 (eight) hours as needed for mild pain or moderate pain.    Yes [provider]  ibuprofen (ADVIL,MOTRIN) 800 MG tablet Take 1 tablet (800 mg total) by mouth 3 (three) times daily. Patient not taking: Reported on 11/08/2014 07/25/14   Evelina Bucy, MD  morphine (MSIR) 15 MG tablet Take 1 tablet (15 mg total) by mouth every 6 (six) hours as needed for severe pain. 10/13/16   Jancie Kercher A, PA-C  omeprazole (PRILOSEC) 20 MG capsule Take 1 capsule (20 mg total) by mouth daily. Patient not taking: Reported on 02/07/2016 11/08/14   Davonna Belling, MD  ondansetron (ZOFRAN ODT) 4 MG disintegrating tablet Take 1 tablet (4 mg total) by mouth every 8 (eight) hours as needed for nausea or vomiting. 10/13/16   Rodell Perna A, PA-C  tamsulosin (FLOMAX) 0.4 MG CAPS capsule Take 1 capsule (0.4 mg total) by mouth daily after supper. 10/13/16 10/16/16  Rodell Perna A, PA-C  verapamil (CALAN-SR) 120 MG CR tablet Take 1 tablet at bedtime Patient not taking: Reported on  07/25/2014 02/03/14   Cameron Sprang, MD  zolmitriptan (ZOMIG) 5 MG tablet Take 1 tablet (5 mg total) by mouth as needed for migraine. May take second dose after 2 hours if headache persists. Do not take more than 3 a week Patient not taking: Reported on 02/03/2014 09/20/13   Cameron Sprang, MD    Family History Family History  Problem Relation Age of Onset  . Depression Mother   . Hepatitis Mother        c  . Osteopenia Mother   . Alcohol abuse Father   . COPD Father     . Heart disease Father   . Cirrhosis Father   . Hepatitis Father        c  . Osteopenia Father     Social History Social History  Substance Use Topics  . Smoking status: Never Smoker  . Smokeless tobacco: Never Used  . Alcohol use Yes     Comment: rare     Allergies   Codeine and Vicodin [hydrocodone-acetaminophen]   Review of Systems Review of Systems  Constitutional: Positive for chills, diaphoresis and fever.  Respiratory: Negative for shortness of breath.   Cardiovascular: Negative for chest pain.  Gastrointestinal: Positive for abdominal pain, nausea and vomiting. Negative for blood in stool and diarrhea.  Genitourinary: Positive for difficulty urinating, dysuria, flank pain and frequency. Negative for hematuria, vaginal bleeding, vaginal discharge and vaginal pain.  Neurological: Negative for syncope and headaches.  All other systems reviewed and are negative.    Physical Exam Updated Vital Signs BP 106/61   Pulse 66   Temp (!) 97.5 F (36.4 C) (Oral)   Resp 16   LMP 09/12/2016   SpO2 97%   Physical Exam  Constitutional: She appears well-developed and well-nourished. No distress.  HENT:  Head: Normocephalic and atraumatic.  Eyes: Conjunctivae are normal. Right eye exhibits no discharge. Left eye exhibits no discharge.  Neck: Normal range of motion. Neck supple. No JVD present. No tracheal deviation present.  Cardiovascular: Normal rate, regular rhythm and normal heart sounds.   Pulmonary/Chest: Effort normal and breath sounds normal. She has no wheezes. She has no rales.  Abdominal: Soft. She exhibits no distension. There is tenderness.  Hypoactive bowel sounds,mild left lower quadrant tenderness to palpation and suprapubic pressure, mild left CVA tenderness. Murphy's sign absent, Rovsing sign absent, no TTP at McBurney's point  Musculoskeletal: She exhibits no edema.  Neurological: She is alert.  Skin: Skin is warm and dry. No erythema.  Psychiatric:  She has a normal mood and affect. Her behavior is normal.  Nursing note and vitals reviewed.    ED Treatments / Results  Labs (all labs ordered are listed, but only abnormal results are displayed) Labs Reviewed  URINALYSIS, ROUTINE W REFLEX MICROSCOPIC - Abnormal; Notable for the following:       Result Value   APPearance CLOUDY (*)    Specific Gravity, Urine >1.030 (*)    Hgb urine dipstick LARGE (*)    Bilirubin Urine SMALL (*)    Protein, ur 30 (*)    Leukocytes, UA TRACE (*)    All other components within normal limits  BASIC METABOLIC PANEL - Abnormal; Notable for the following:    Glucose, Bld 129 (*)    All other components within normal limits  URINALYSIS, MICROSCOPIC (REFLEX) - Abnormal; Notable for the following:    Bacteria, UA FEW (*)    Squamous Epithelial / LPF 0-5 (*)    All other  components within normal limits  URINE CULTURE  CBC  I-STAT BETA HCG BLOOD, ED ([facility], [facility], [facility] ONLY)    EKG  EKG Interpretation None       Radiology Ct Renal Stone Study  Result Date: 10/13/2016 CLINICAL DATA:  Left flank pain. EXAM: CT ABDOMEN AND PELVIS WITHOUT CONTRAST TECHNIQUE: Multidetector CT imaging of the abdomen and pelvis was performed following the standard protocol without IV contrast. COMPARISON:  Right upper quadrant ultrasound 2015. FINDINGS: Lower chest: No acute abnormality. Hepatobiliary: No focal liver abnormality is seen. Status post cholecystectomy. No biliary dilatation. Pancreas: Unremarkable. No pancreatic ductal dilatation or surrounding inflammatory changes. Spleen: Normal in size without focal abnormality. Adrenals/Urinary Tract: The adrenal glands are unremarkable. There is a 2 mm calculus at the left UVJ. Minimal left hydroureteronephrosis. Punctate nonobstructive right renal calculus. The bladder is decompressed. Stomach/Bowel: Stomach is within normal limits. Appendix is not definitively identified, however there are no secondary signs of inflammation in the  right lower quadrant. No evidence of bowel wall thickening, distention, or inflammatory changes. Vascular/Lymphatic: No significant vascular findings are present. No enlarged abdominal or pelvic lymph nodes. Reproductive: Uterus and bilateral adnexa are unremarkable. Prior tubal ligation. Other: Trace free fluid in the pelvis, likely physiologic. No pneumoperitoneum. Musculoskeletal: No acute or significant osseous findings. IMPRESSION: 1. Tiny 2 mm calculus at the left UVJ with minimal left hydroureteronephrosis. 2. Punctate nonobstructive right nephrolithiasis. Electronically Signed   By: Titus Dubin M.D.   On: 10/13/2016 11:13    Procedures Procedures (including critical care time)  Medications Ordered in ED Medications  ketorolac (TORADOL) 30 MG/ML injection 30 mg (30 mg Intravenous Given 10/13/16 1113)  ondansetron (ZOFRAN) injection 4 mg (4 mg Intravenous Given 10/13/16 1113)  sodium chloride 0.9 % bolus 1,000 mL (1,000 mLs Intravenous New Bag/Given 10/13/16 1113)     Initial Impression / Assessment and Plan / ED Course  I have reviewed the triage vital signs and the nursing notes.  Pertinent labs & imaging results that were available during my care of the patient were reviewed by me and considered in my medical decision making (see chart for details).     Patient with left lower quadrant and flank painas well as dysuria, nausea, vomiting since this morning. Afebrile, vital signs are stable. UA concerning for nephrolithiasis, and CT renal stone study confirms a 2 mm calculus at the left UVJ with minimal left hydroureteronephrosis as well as nonobstructive right nephrolithiasis. Remainder of lab work is reassuring. No evidence of infected stone or pyelonephritis. Doubt appendicitis without right lower quadrant pain, leukocytosis, or fever. Doubt colitis, obstruction, perforation, or other acute surgical abdominal pathology. Doubt ectopic pregnancy, TOA, PID, or ovarian torsion. Pain  managed while in the ED. She is stable for discharge home with Zofran, Flomax, and small amount of morphine IR for severe breakthrough pain.she will follow-up with urology for reevaluation. Discussed indications for return to the ED. Pt and pt's MIL verbalized understanding of and agreement with plan and pt is safe for discharge home at this time.   Final Clinical Impressions(s) / ED Diagnoses   Final diagnoses:  Kidney stone    New Prescriptions New Prescriptions   MORPHINE (MSIR) 15 MG TABLET    Take 1 tablet (15 mg total) by mouth every 6 (six) hours as needed for severe pain.   ONDANSETRON (ZOFRAN ODT) 4 MG DISINTEGRATING TABLET    Take 1 tablet (4 mg total) by mouth every 8 (eight) hours as needed for nausea or vomiting.  TAMSULOSIN (FLOMAX) 0.4 MG CAPS CAPSULE    Take 1 capsule (0.4 mg total) by mouth daily after supper.     Renita Papa, PA-C 10/13/16 1247    Dorie Rank, MD 10/17/16 [phone]

## 2016-10-13 NOTE — Discharge Instructions (Signed)
Alternate 600 mg of ibuprofen and [phone] mg of Tylenol every 3 hours as needed for pain. Do not exceed 4000 mg of Tylenol daily. Take Zofran as needed for nausea. You may take Flomax at night, but be aware this medication may drop her blood pressure so be careful when you change positions. You may not need this medication. You can take morphine as needed for severe pain, but this medication may make you nauseous. Do not drive, drink alcohol, or operate heavy machinery with this medication.follow-up with urology for reevaluation of your symptoms. Return to the ED if any concerning signs or symptoms develop such as worsening pain, fevers, spreading of pain up the back, or if you're unable to keep any food or drink down.

## 2016-10-13 NOTE — ED Notes (Signed)
Pt ambulated to RR with steady gait. Pt c/o of urinary frequency and pain in LLQ that all started this morning at 430. Pt reports she goes to the RR and is unable to get much out.

## 2016-10-13 NOTE — ED Notes (Signed)
Patient transported to CT 

## 2016-10-13 NOTE — ED Triage Notes (Signed)
To ED for eval of uti symptoms since waking this am. States she took bath which made pain a little better. While driving to work she started with LLQ pain, nausea, and vomiting. Has been taking OTC cold meds since last week. Appears uncomfortable

## 2016-10-14 LAB — URINE CULTURE: Culture: NO GROWTH

## 2017-06-09 IMAGING — DX DG CHEST 2V
2 series · 2 of 2 positions shown · non-contrast
Comparison: None.

CLINICAL DATA: Chest pain

EXAM:
CHEST  2 VIEW

[w chest pa]
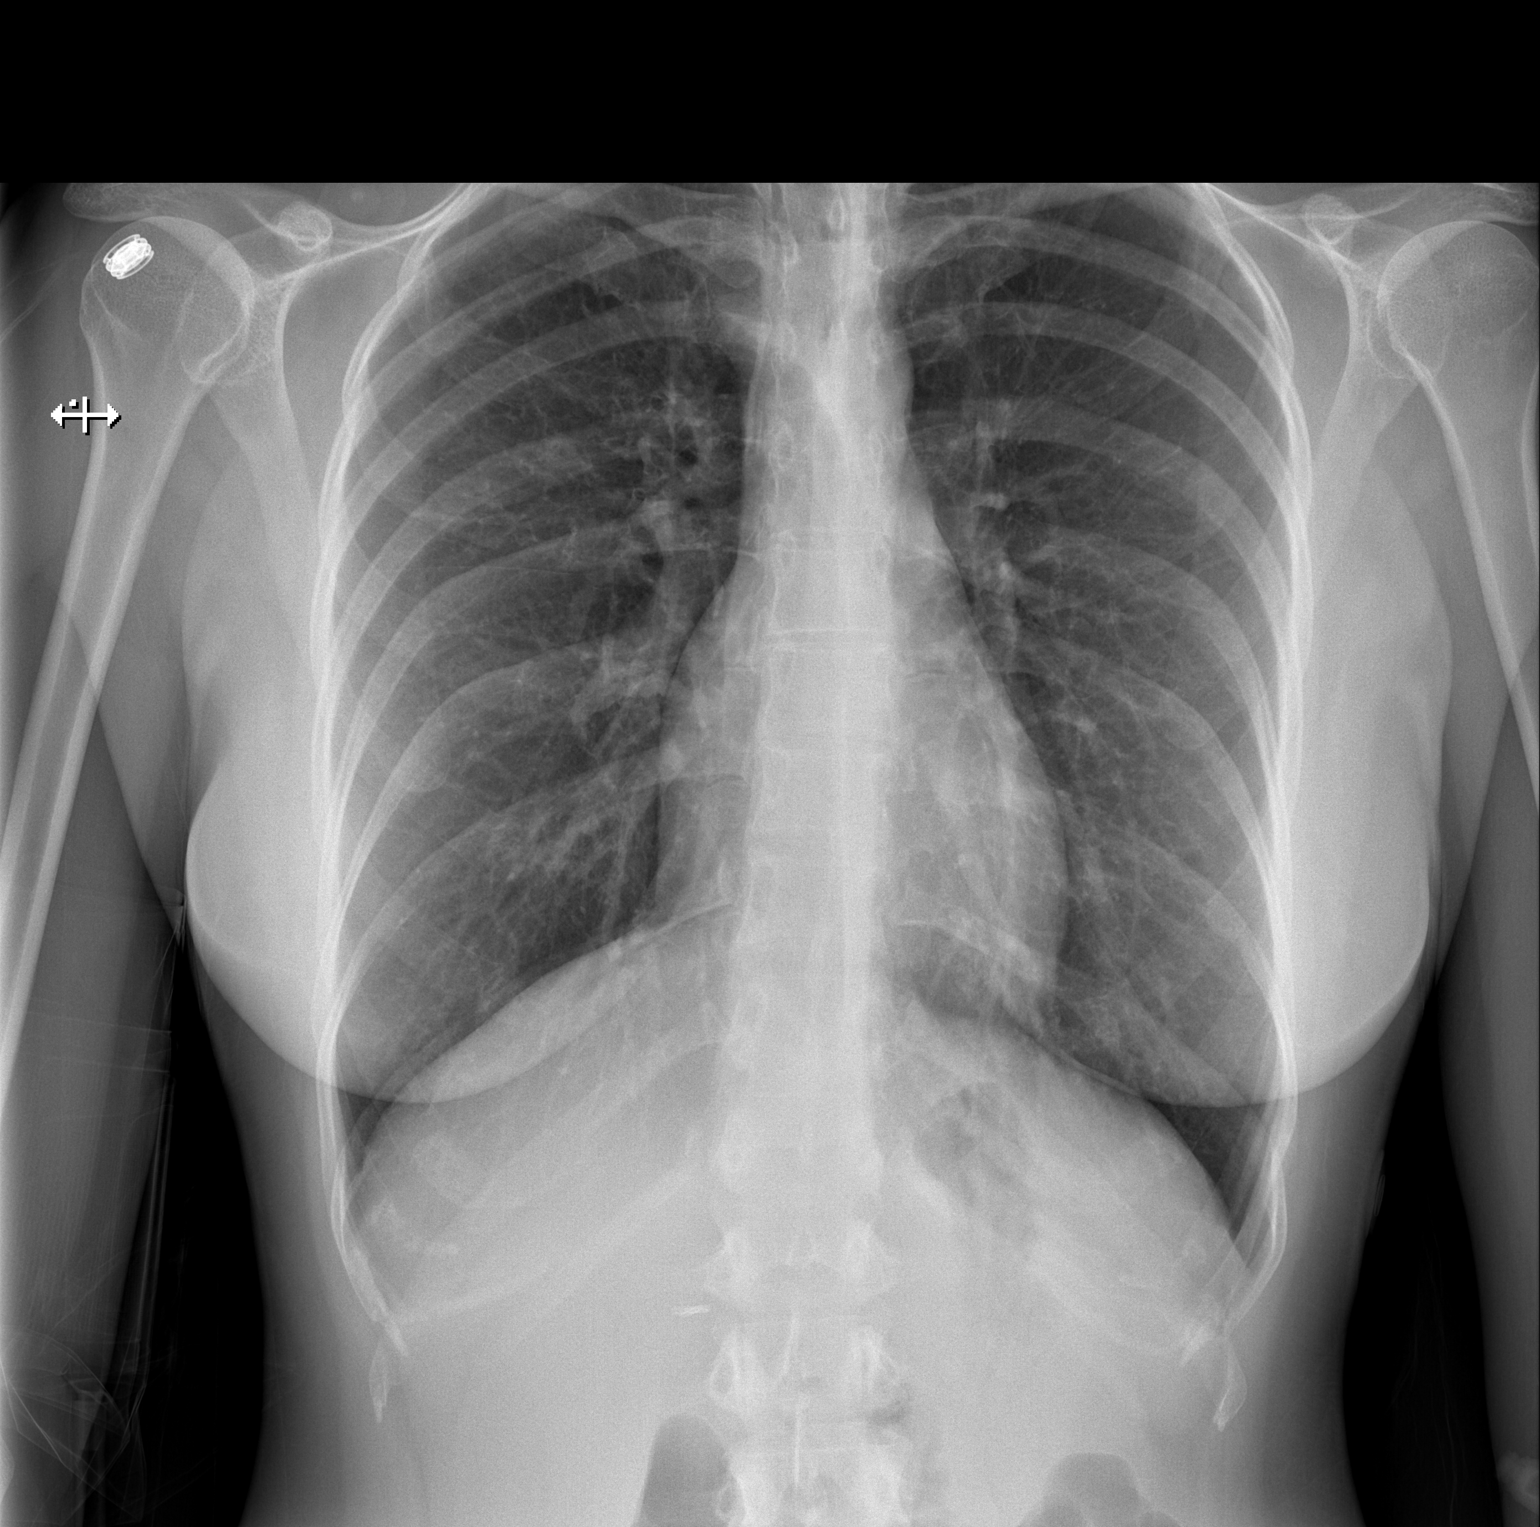

[w chest lat]
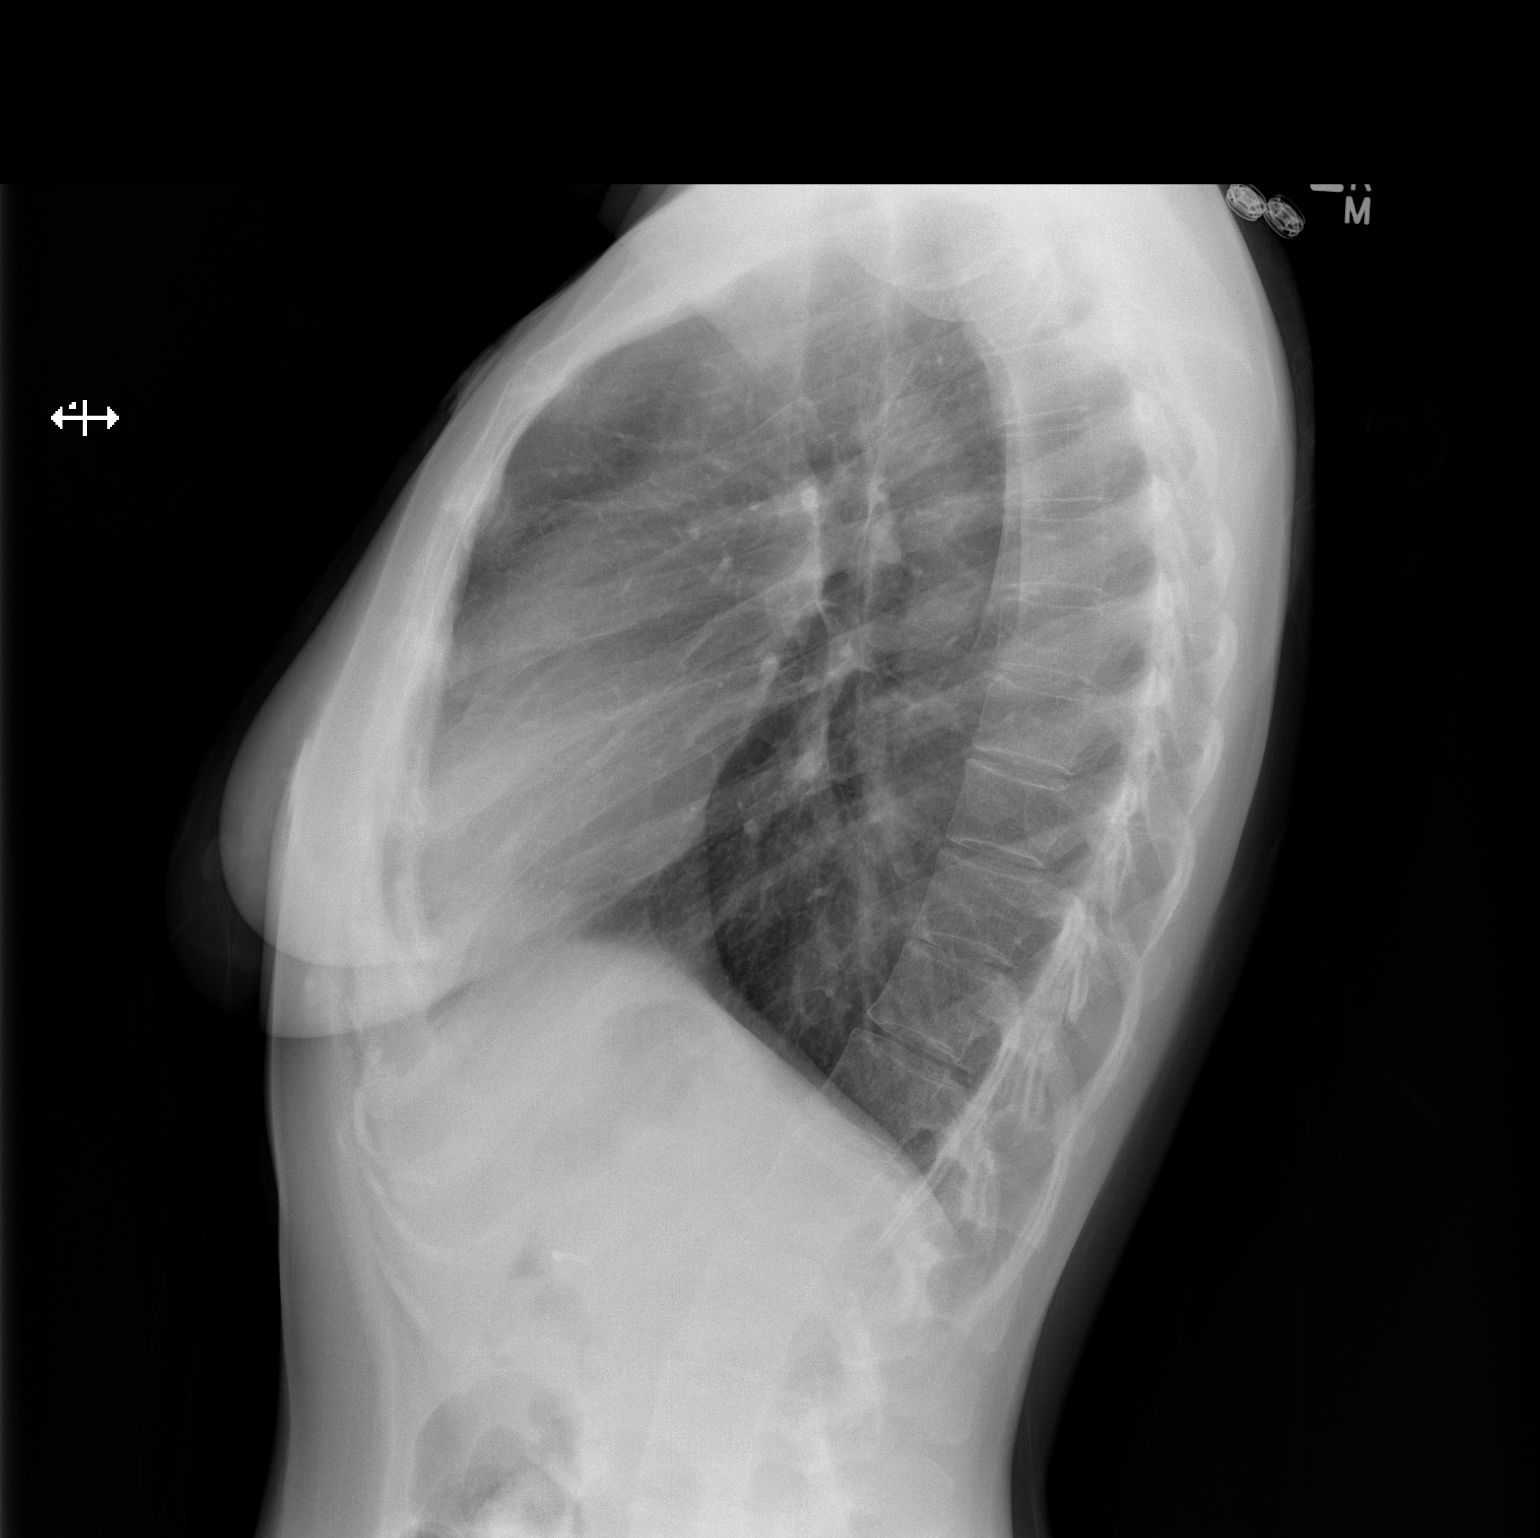

[2 of 2 positions shown; findings below may reference images not displayed]

FINDINGS: Cardiomediastinal silhouette is unremarkable. Mild hyperinflation.
No acute infiltrate or pleural effusion. No pulmonary edema. Bony
thorax is unremarkable.
IMPRESSION: No active cardiopulmonary disease.  Mild hyperinflation.

## 2018-11-24 ENCOUNTER — Other Ambulatory Visit: Payer: Self-pay

## 2018-11-24 ENCOUNTER — Encounter (HOSPITAL_COMMUNITY): Payer: Self-pay

## 2018-11-24 ENCOUNTER — Telehealth: Payer: Self-pay | Admitting: Family Medicine

## 2018-11-24 ENCOUNTER — Emergency Department (HOSPITAL_COMMUNITY)
Admission: EM | Admit: 2018-11-24 | Discharge: 2018-11-25 | Discharge status: Against Medical Advice | Payer: Self-pay | Attending: Emergency Medicine | Admitting: Emergency Medicine

## 2018-11-24 ENCOUNTER — Ambulatory Visit: Payer: Self-pay | Admitting: *Deleted

## 2018-11-24 DIAGNOSIS — Z5321 Procedure and treatment not carried out due to patient leaving prior to being seen by health care provider: Secondary | ICD-10-CM | POA: Insufficient documentation

## 2018-11-24 LAB — COMPREHENSIVE METABOLIC PANEL
ALT: 16 U/L (ref 0–44)
AST: 18 U/L (ref 15–41)
Albumin: 4 g/dL (ref 3.5–5.0)
Alkaline Phosphatase: 39 U/L (ref 38–126)
Anion gap: 9 (ref 5–15)
BUN: 8 mg/dL (ref 6–20)
CO2: 25 mmol/L (ref 22–32)
Calcium: 9.3 mg/dL (ref 8.9–10.3)
Chloride: 104 mmol/L (ref 98–111)
Creatinine, Ser: 0.52 mg/dL (ref 0.44–1.00)
GFR calc Af Amer: >60 mL/min (ref >60)
GFR calc non Af Amer: >60 mL/min (ref >60)
Glucose, Bld: 100 mg/dL — ABNORMAL HIGH (ref 70–99)
Potassium: 4.1 mmol/L (ref 3.5–5.1)
Sodium: 138 mmol/L (ref 135–145)
Total Bilirubin: 0.4 mg/dL (ref 0.3–1.2)
Total Protein: 6.9 g/dL (ref 6.5–8.1)

## 2018-11-24 LAB — CBC
HCT: 38.2 % (ref 36.0–46.0)
Hemoglobin: 12.8 g/dL (ref 12.0–15.0)
MCH: 28.8 pg (ref 26.0–34.0)
MCHC: 33.5 g/dL (ref 30.0–36.0)
MCV: 86 fL (ref 80.0–100.0)
Platelets: 268 10*3/uL (ref 150–400)
RBC: 4.44 MIL/uL (ref 3.87–5.11)
RDW: 13.2 % (ref 11.5–15.5)
WBC: 6.3 10*3/uL (ref 4.0–10.5)
nRBC: 0 % (ref 0.0–0.2)

## 2018-11-24 LAB — TYPE AND SCREEN
ABO/RH(D): A POS
Antibody Screen: NEGATIVE

## 2018-11-24 LAB — ABO/RH: ABO/RH(D): A POS

## 2018-11-24 LAB — I-STAT BETA HCG BLOOD, ED (MC, WL, AP ONLY): I-stat hCG, quantitative: <5 m[IU]/mL (ref <5)

## 2018-11-24 NOTE — Telephone Encounter (Signed)
Pt is aware that Dr. Sarajane Jews is not accepting any newpt.  Pt stated that since she was a pt of Dr. Raliegh Ip and had seen Dr. Sarajane Jews several times she thought that she would just ask.  Pt was called and she is well aware that Dr. Sarajane Jews is not accepting new pts.

## 2018-11-24 NOTE — Telephone Encounter (Signed)
Pt has not been seen here since 2015 and has no current PCP.

## 2018-11-24 NOTE — ED Triage Notes (Signed)
Pt reports having period 3 times since end of august.  Pt has went through 7 super tampons since 6am.  Pt has had black stools x several week, liquid stools.  Pt normally has issues with mushy to liquid stools "for years" d/t GI problems.  C/o intermittant dizziness x 1 week.  H/o endometriosis and fibroid cyst.

## 2018-11-24 NOTE — Telephone Encounter (Signed)
Pt called with complaints of heavy bleeding; this is her 3rd period since October 17 2018; super tampons are lasting 1 hour; she does have anemia, endometriosis,and fibroid cysts; she is also having dizziness and feels like she is going to pass out; the pt is also having black stools, and has not "had a normal bowel movement in forever"; recommendations made per nurse triage protocol; the pt verbalized understanding, but would like to see Dr Sarajane Jews at [facility]; pt was also advised  another option is to contact her GYN; the pt was seen [facility] but says she has not been there in several years; pt transferred to [facility], and she concurs with initial recommendations; the pt verbalized understanding; will route to office for notification.   Reason for Disposition . SEVERE vaginal bleeding (e.g., soaking 2 pads or tampons per hour and present 2 or more hours; 1 menstrual cup every 2 hours)  Answer Assessment - Initial Assessment Questions 1. AMOUNT: "Describe the bleeding that you are having."    - SPOTTING: spotting, or pinkish / brownish mucous discharge; does not fill panti-liner or pad    - MILD:  less than 1 pad / hour; less than patient's usual menstrual bleeding   - MODERATE: 1-2 pads / hour; 1 menstrual cup every 6 hours; small-medium blood clots (e.g., pea, grape, small coin)   - SEVERE: soaking 2 or more pads/hour for 2 or more hours; 1 menstrual cup every 2 hours; bleeding not contained by pads or continuous red blood from vagina; large blood clots (e.g., golf ball, large coin)      Severe (saoking super tampons every 1-2 hours) 2. ONSET: "When did the bleeding begin?" "Is it continuing now?"     11/21/2018 3. MENSTRUAL PERIOD: "When was the last normal menstrual period?" "How is this different than your period?"   Last normal 6 months ago; Extremely heavy 4. REGULARITY: "How regular are your periods?"     Every 24-28 days 5. ABDOMINAL PAIN: "Do you have any pain?"  "How bad is the pain?"  (e.g., Scale 1-10; mild, moderate, or severe)   - MILD (1-3): doesn't interfere with normal activities, abdomen soft and not tender to touch    - MODERATE (4-7): interferes with normal activities or awakens from sleep, tender to touch    - SEVERE (8-10): excruciating pain, doubled over, unable to do any normal activities      3 out of 10 6. PREGNANCY: "Could you be pregnant?" "Are you sexually active?" "Did you recently give birth?"     BTL but could be pregnant 7. BREASTFEEDING: "Are you breastfeeding?"    no 8. HORMONES: "Are you taking any hormone medications, prescription or OTC?" (e.g., birth control pills, estrogen) no 9. BLOOD THINNERS: "Do you take any blood thinners?" (e.g., Coumadin/warfarin, Pradaxa/dabigatran, aspirin)    no 10. CAUSE: "What do you think is causing the bleeding?" (e.g., recent gyn surgery, recent gyn procedure; known bleeding disorder, cervical cancer, polycystic ovarian disease, fibroids)       Endometriosis and fibroid cysts 11. HEMODYNAMIC STATUS: "Are you weak or feeling lightheaded?" If so, ask: "Can you stand and walk normally?"        Yes, lightheaded,  jittery 12. OTHER SYMPTOMS: "What other symptoms are you having with the bleeding?" (e.g., passed tissue, vaginal discharge, fever, menstrual-type cramps)      Dark stools  Protocols used: VAGINAL BLEEDING - ABNORMAL-A-AH

## 2018-11-25 ENCOUNTER — Emergency Department (HOSPITAL_BASED_OUTPATIENT_CLINIC_OR_DEPARTMENT_OTHER)
Admission: EM | Admit: 2018-11-25 | Discharge: 2018-11-25 | Disposition: A | Discharge status: Home / Self Care | Payer: Self-pay | Attending: Emergency Medicine | Admitting: Emergency Medicine

## 2018-11-25 ENCOUNTER — Encounter (HOSPITAL_BASED_OUTPATIENT_CLINIC_OR_DEPARTMENT_OTHER): Payer: Self-pay | Admitting: *Deleted

## 2018-11-25 ENCOUNTER — Other Ambulatory Visit: Payer: Self-pay

## 2018-11-25 DIAGNOSIS — Z8742 Personal history of other diseases of the female genital tract: Secondary | ICD-10-CM | POA: Insufficient documentation

## 2018-11-25 DIAGNOSIS — R42 Dizziness and giddiness: Secondary | ICD-10-CM | POA: Insufficient documentation

## 2018-11-25 DIAGNOSIS — R198 Other specified symptoms and signs involving the digestive system and abdomen: Secondary | ICD-10-CM | POA: Insufficient documentation

## 2018-11-25 DIAGNOSIS — Z79899 Other long term (current) drug therapy: Secondary | ICD-10-CM | POA: Insufficient documentation

## 2018-11-25 LAB — CBG MONITORING, ED: Glucose-Capillary: 98 mg/dL (ref 70–99)

## 2018-11-25 MED ORDER — MECLIZINE HCL 25 MG PO TABS: 25.0000 mg | ORAL_TABLET | Freq: Three times a day (TID) | ORAL | 0 refills | Status: DC | PRN

## 2018-11-25 MED FILL — MECLIZINE 25 MG TABLET: 25 | 10 days supply | Qty: 30 | Fill #0

## 2018-11-25 NOTE — Discharge Instructions (Signed)
You were seen in the ER for lightheadedness, dizziness, abnormal vaginal bleeding and abnormal stools.  EKG was normal with normal heart rhythm.  Lab work done yesterday in the ER showed normal hemoglobin, electrolytes.  You declined stool test to test for occult blood but likely the color is due to Pepto-Bismol.  Your vaginal bleeding is stopped today.  Some of the dizziness you describe could be from vertigo.  We will try meclizine every 8 hours to see if this helps.  Stay hydrated.  Follow-up with your primary care doctor closely for further discussion of your symptoms.  You will benefit from formal GYN and GI evaluations for your ongoing irregular vaginal bleeding and abnormal stools.

## 2018-11-25 NOTE — ED Notes (Signed)
Pt did not want any help going to the restroom. If help is needed she is aware of the call light in the restroom.

## 2018-11-25 NOTE — ED Notes (Signed)
Pt reports she was feeling lightheaded and dizzy. VS reassessed and CBG checked. All WNL. Pt reassured to wait to be seen by a provider for further testing.

## 2018-11-25 NOTE — ED Triage Notes (Signed)
Dizziness x 2 weeks. Abnormal vaginal bleeding. Black tarry stools for a month. Diarrhea for months per patient.

## 2018-11-25 NOTE — ED Notes (Signed)
Pt stated she was leaving, seen walking towards exit

## 2018-11-25 NOTE — ED Provider Notes (Signed)
[facility] EMERGENCY DEPARTMENT Provider Note   CSN: [id number] Arrival date & time: 11/25/18  1238     History   Chief Complaint Chief Complaint  Patient presents with   Dizziness    HPI Janet Myers is a 43 y.o. female with history of headaches, uterine fibroids, iron and B12 deficiency, tubal ligation here for evaluation of several symptoms including dizziness, heavy vaginal bleeding and black stools.  She went to [facility], ER yesterday and waited for 12 hours but left without being seen.  Her PCP told her to come to the ER.  Reports chronic dizziness for "years".  Describes as room spinning but also sometimes feeling lightheaded.  It happens when she is sitting, laying, standing, driving.  It can last from a few seconds to 20 to 30 minutes at a time.  It is better if she sits down and stops moving.  She feels jittery and nervous inside sometimes with the episodes.  Several years ago she was told she was iron and B12 deficient and needed shots for it.  Has also been feeling associated fatigue, low energy, constantly being cold.  She is worried that she is anemic again and that her iron and B12 levels are low.  States last time she felt this dizzy was when she needed injections.  No associated chest pain, shortness of breath, palpitations, syncope, nausea, vomiting.  No headache, diplopia, difficulty walking.  She ambulated to the bathroom on her own here without any assistance. Also reports long history of irregular vaginal bleeding and periods.  She has had 3 periods in September.  She began having heavy vaginal bleeding on Monday and states she has gone through 1 super tampon every hour.  The bleeding actually eased off yesterday morning and today she has only had to go through 1 or 2 tampons but now does not need anything and is not having any more bleeding.  Had a little bit of pelvic cramping on Tuesday but this completely resolved.  GYN told her to come to ER.  She was seen  in ER and had [place] two years ago and diagnosed with fibroids. States before they have "ruptured".  Also reports loose, not formed black looking stools for the last 4 to 6 weeks.  It is worse after she eats meals.  She is taking Pepto-Bismol 4-5 times per week to help with the symptoms.  Sometimes she can eat lunch at work because she has sudden urge to have a bowel movement at work.  She takes occasional ibuprofen as needed but very rarely. No ETOH use. No h/o ulcers, gastritis.  No h/o GIB.   No Cp, Sob, palpitations, syncope, nausea, vomiting. No headache, diplopia, difficulty walking.  No abdominal or pelvic pain.     HPI  Past Medical History:  Diagnosis Date   Anxiety    father recently passed away   Gallstones    Headache(784.0)    Uterine fibroid     Patient Active Problem List   Diagnosis Date Noted   Cluster headaches 05/30/2013   B12 deficiency 07/22/2012   Anemia, iron deficiency 07/22/2012   Gallstones 11/11/2011    Past Surgical History:  Procedure Laterality Date   CESAREAN SECTION     CHOLECYSTECTOMY     EYE SURGERY     LAPAROSCOPIC TUBAL LIGATION  04/21/2011   Procedure: LAPAROSCOPIC TUBAL LIGATION;  Surgeon: Marylynn Pearson, MD;  Location: [facility];  Service: Gynecology;  Laterality: Bilateral;  Bilateral with Filshie Clips  OB History   No obstetric history on file.      Home Medications    Prior to Admission medications   Medication Sig Start Date End Date Taking? Authorizing Provider  ibuprofen (ADVIL,MOTRIN) 200 MG tablet Take 800 mg by mouth every 8 (eight) hours as needed for mild pain or moderate pain.     [provider]  ibuprofen (ADVIL,MOTRIN) 800 MG tablet Take 1 tablet (800 mg total) by mouth 3 (three) times daily. Patient not taking: Reported on 11/08/2014 07/25/14   Evelina Bucy, MD  meclizine (ANTIVERT) 25 MG tablet Take 1 tablet (25 mg total) by mouth 3 (three) times daily as needed for dizziness. 11/25/18   Kinnie Feil, PA-C  morphine (MSIR) 15 MG tablet Take 1 tablet (15 mg total) by mouth every 6 (six) hours as needed for severe pain. 10/13/16   Fawze, Mina A, PA-C  omeprazole (PRILOSEC) 20 MG capsule Take 1 capsule (20 mg total) by mouth daily. Patient not taking: Reported on 02/07/2016 11/08/14   Davonna Belling, MD  ondansetron (ZOFRAN ODT) 4 MG disintegrating tablet Take 1 tablet (4 mg total) by mouth every 8 (eight) hours as needed for nausea or vomiting. 10/13/16   Rodell Perna A, PA-C  verapamil (CALAN-SR) 120 MG CR tablet Take 1 tablet at bedtime Patient not taking: Reported on 07/25/2014 02/03/14   Cameron Sprang, MD  zolmitriptan (ZOMIG) 5 MG tablet Take 1 tablet (5 mg total) by mouth as needed for migraine. May take second dose after 2 hours if headache persists. Do not take more than 3 a week Patient not taking: Reported on 02/03/2014 09/20/13   Cameron Sprang, MD  JUNEL FE 1/20 1-20 MG-MCG tablet Take 1 tablet by mouth daily. Loestrin 12/21/10 04/21/11  [provider]    Family History Family History  Problem Relation Age of Onset   Depression Mother    Hepatitis Mother        c   Osteopenia Mother    Alcohol abuse Father    COPD Father    Heart disease Father    Cirrhosis Father    Hepatitis Father        c   Osteopenia Father     Social History Social History   Tobacco Use   Smoking status: Never Smoker   Smokeless tobacco: Never Used  Substance Use Topics   Alcohol use: Yes    Comment: rare   Drug use: No     Allergies   Codeine and Vicodin [hydrocodone-acetaminophen]   Review of Systems Review of Systems  Constitutional: Positive for fatigue.  Gastrointestinal:       Black stools  Loose stools   Endocrine: Positive for cold intolerance.  Genitourinary: Positive for menstrual problem, pelvic pain (on tuesday, resolved) and vaginal bleeding (resolved).  Neurological: Positive for dizziness and light-headedness.  Psychiatric/Behavioral:  The patient is nervous/anxious.   All other systems reviewed and are negative.    Physical Exam Updated Vital Signs BP (!) 117/57    Pulse 79    Temp 98.7 F (37.1 C) (Oral)    Resp 13    Ht 5\' 1"  (1.549 m)    Wt 57.6 kg    LMP 11/22/2018 Comment: see note   SpO2 100%    BMI 24.00 kg/m   Physical Exam Vitals signs and nursing note reviewed.  Constitutional:      Appearance: She is well-developed.     Comments: Non toxic in NAD. Sits up  in bed and ambulates in ER without assistance or difficulty   HENT:     Head: Normocephalic and atraumatic.     Nose: Nose normal.  Eyes:     Conjunctiva/sclera: Conjunctivae normal.     Comments: PERRL and EOMs intact bilaterally   Neck:     Musculoskeletal: Normal range of motion.  Cardiovascular:     Rate and Rhythm: Normal rate and regular rhythm.  Pulmonary:     Effort: Pulmonary effort is normal.     Breath sounds: Normal breath sounds.  Abdominal:     General: Bowel sounds are normal.     Palpations: Abdomen is soft.     Tenderness: There is no abdominal tenderness.     Comments: No lower abd/pelvic tenderness.   Musculoskeletal: Normal range of motion.  Skin:    General: Skin is warm and dry.     Capillary Refill: Capillary refill takes less than 2 seconds.  Neurological:     Mental Status: She is alert.     Comments:   Mental Status: Patient is awake, alert, oriented to person, place, year, and situation. Patient is able to give a clear and coherent history.  Speech is fluent and clear without dysarthria or aphasia.   No signs of neglect. Cranial Nerves: I not tested II visual fields full not tested. PERRL.   III, IV, VI EOMs intact without ptosis or diplopia  V sensation to light touch intact in all 3 divisions of trigeminal nerve bilaterally  VII facial movements symmetric bilaterally VIII hearing intact to voice/conversation  IX, X no uvula deviation, symmetric rise of soft palate/uvula XI 5/5 SCM and trapezius  strength bilaterally  XII tongue protrusion midline, symmetric L/R movements  Motor: Strength 5/5 in upper/lower extremities .   Sensation to light touch intact in face, upper/lower extremities. No pronator drift. No leg drop.  Cerebellar: No ataxia with finger to nose.  Steady gait/No truncal sway. Normal Romberg.   Psychiatric:        Behavior: Behavior normal.      ED Treatments / Results  Labs (all labs ordered are listed, but only abnormal results are displayed) Labs Reviewed - No data to display  EKG EKG Interpretation  Date/Time:  Thursday November 25 2018 14:55:46 EDT Ventricular Rate:  60 PR Interval:    QRS Duration: 113 QT Interval:  416 QTC Calculation: 416 R Axis:   96 Text Interpretation: Sinus rhythm Borderline intraventricular conduction delay When compared to prior, no significant cahnves seen. NO STEMI Confirmed by Antony Blackbird [phone]) on 11/25/2018 3:08:32 PM   Radiology No results found.  Procedures Procedures (including critical care time)  Medications Ordered in ED Medications - No data to display   Initial Impression / Assessment and Plan / ED Course  I have reviewed the triage vital signs and the nursing notes.  Pertinent labs & imaging results that were available during my care of the patient were reviewed by me and considered in my medical decision making (see chart for details).      DDX includes vasovagal versus orthostatic related symptoms.  Given report of heavy vaginal bleeding and black starry stools also considering symptomatic anemia.  EMR reviewed.  Several years ago she was seen by PCP for "chronic dizziness".  She admits she has had dizziness/lightheadedness for several years.  Her hemoglobin has been normal for the last 4 to 5 years.  ER work-up obtained last night in the ER reviewed, vastly normal including hemoglobin, electrolytes, pregnancy test.  EKG obtained today without any arrhythmias.  She is ambulatory in the ER  without any dizziness or lightheadedness.  Normal blood pressure.  Benign cardio vascular and pulmonary exam.  Full neurological exam without any finger-to-nose or gait ataxia.  Given benign work-up last night and EKG today, benign exam here I do not think further emergent lab work, imaging or admission is indicated.  She described black stools for the last month but is taking Pepto-Bismol 4-5 times a week for her soft, irregular BMs.  I offered Hemoccult but explained that given normal hemoglobin it was likely Pepto-Bismol.  She was comfortable declining DRE.  She is no longer having vaginal bleeding either.  No abdominal pelvic tenderness.  Work-up and exam do not point to a life-threatening process.  Her dizziness is described as both room spinning and feeling lightheaded but again her neuro exam is benign she has no chest pain, shortness of breath and I considered cardiac and neuro etiology highly unlikely.  We will discharge with meclizine as she describes some vertiginous symptoms, oral hydration and close PCP follow-up.  Encouraged formal GYN and GI evaluation for her ongoing abnormal vaginal bleeding and irregular BMs.  Strict return precautions given.  Final Clinical Impressions(s) / ED Diagnoses   Final diagnoses:  Dizziness  Abnormal bowel movement  History of heavy vaginal bleeding    ED Discharge Orders         Ordered    meclizine (ANTIVERT) 25 MG tablet  3 times daily PRN     11/25/18 1519           Kinnie Feil, PA-C 11/25/18 [place], [place], DO 11/25/18 1536

## 2018-11-29 NOTE — Telephone Encounter (Signed)
Noted  

## 2018-12-20 ENCOUNTER — Other Ambulatory Visit: Payer: Self-pay

## 2018-12-21 ENCOUNTER — Encounter: Payer: Self-pay | Admitting: Family Medicine

## 2018-12-21 ENCOUNTER — Ambulatory Visit (INDEPENDENT_AMBULATORY_CARE_PROVIDER_SITE_OTHER): Payer: Self-pay | Admitting: Family Medicine

## 2018-12-21 VITALS — BP 90/70 | HR 75 | Temp 98.3°F | Ht 61.0 in | Wt 134.0 lb

## 2018-12-21 DIAGNOSIS — N921 Excessive and frequent menstruation with irregular cycle: Secondary | ICD-10-CM

## 2018-12-21 DIAGNOSIS — N809 Endometriosis, unspecified: Secondary | ICD-10-CM | POA: Insufficient documentation

## 2018-12-21 DIAGNOSIS — E039 Hypothyroidism, unspecified: Secondary | ICD-10-CM

## 2018-12-21 DIAGNOSIS — K921 Melena: Secondary | ICD-10-CM

## 2018-12-21 DIAGNOSIS — R42 Dizziness and giddiness: Secondary | ICD-10-CM

## 2018-12-21 DIAGNOSIS — D259 Leiomyoma of uterus, unspecified: Secondary | ICD-10-CM | POA: Insufficient documentation

## 2018-12-21 DIAGNOSIS — D649 Anemia, unspecified: Secondary | ICD-10-CM

## 2018-12-21 MED ORDER — PHENTERMINE HCL 37.5 MG PO CAPS: 37.5000 mg | ORAL_CAPSULE | ORAL | 1 refills | Status: DC

## 2018-12-21 NOTE — Progress Notes (Signed)
   Subjective:    Patient ID: Janet Myers, female    DOB: 09-05-1975, 43 y.o.   MRN: [id number]  HPI Here to re-establish after a 5 year absence, and to follow up a recent ER visit. She had seen Dr. Gerrit Halls for primary care, and now she has switched to [place]. She has several ongoing issues, including recurrent spells of dizziness which started about 2 months ago. During these episodes she feels like the room is moving and she has to hold onto something to keep from falling. She also has cluster headaches, but the dizzy spells are not associated with headaches. She has a hx of B12 deficiency and she took B12 shots briefly some years ago, but this has not been checked in years. She has a hx of anemia, and she has frequent heavy menses. She has uterine fibroids and endometriosis, and she sees Dr. Iva Boop for GYN care. She went to the ER on 11-25-18 for dizziness and she had a normal EKG and labs including a Hgb of 12.8. she was told to follow up with [place]. Today the dizziness is better. She also describes feeling nauseated every morning when she gets up, and then she feels better after she eats something. She has occasional heartburn and frequent diarrhea. Sometimes her stools are black but she says she takes Pepto-Bismol once in awhile. She has an appt with a GI doctor named Dr. Melina Copa in [place] on 12-30-18.    Review of Systems  Constitutional: Negative.   HENT: Negative.   Eyes: Negative.   Respiratory: Negative.   Cardiovascular: Negative.   Gastrointestinal: Positive for diarrhea and nausea. Negative for abdominal distention, abdominal pain, blood in stool, constipation and vomiting.  Genitourinary: Positive for menstrual problem. Negative for dysuria.  Neurological: Positive for dizziness and headaches. Negative for tremors, seizures, syncope, facial asymmetry, speech difficulty, weakness, light-headedness and numbness.       Objective:   Physical Exam Constitutional:    General: She is not in acute distress.    Appearance: Normal appearance.  Cardiovascular:     Rate and Rhythm: Normal rate and regular rhythm.     Pulses: Normal pulses.     Heart sounds: Normal heart sounds.  Pulmonary:     Effort: Pulmonary effort is normal.     Breath sounds: Normal breath sounds.  Abdominal:     General: Abdomen is flat. Bowel sounds are normal. There is no distension.     Palpations: Abdomen is soft. There is no mass.     Tenderness: There is no abdominal tenderness. There is no guarding or rebound.     Hernia: No hernia is present.  Lymphadenopathy:     Cervical: No cervical adenopathy.  Neurological:     General: No focal deficit present.     Mental Status: She is alert and oriented to person, place, and time.  Psychiatric:        Thought Content: Thought content normal.     Comments: Anxious            Assessment & Plan:  Intro visit for this woman with ongoing headaches, vertigo, heavy menses, and black stools. She likely has GERD and possibly gastric ulcers so she will start taking Omeprazole 20 mg daily. She will see Dr. Melina Copa as above for a GI consult. She will follow up with GYN for the menorrhagia. We will get labs today to include a thyroid panel, a B12, and iron and ferritin.  Alysia Penna, MD

## 2018-12-22 LAB — T3, FREE: T3, Free: 3.4 pg/mL (ref 2.3–4.2)

## 2018-12-22 LAB — VITAMIN B12: Vitamin B-12: 210 pg/mL — ABNORMAL LOW (ref 211–911)

## 2018-12-22 LAB — IBC + FERRITIN
Ferritin: 17.1 ng/mL (ref 10.0–291.0)
Iron: 63 ug/dL (ref 42–145)
Saturation Ratios: 16 % — ABNORMAL LOW (ref 20.0–50.0)
Transferrin: 282 mg/dL (ref 212.0–360.0)

## 2018-12-22 LAB — TSH: TSH: 1.53 u[IU]/mL (ref 0.35–4.50)

## 2018-12-22 LAB — T4, FREE: Free T4: 0.79 ng/dL (ref 0.60–1.60)

## 2018-12-30 ENCOUNTER — Other Ambulatory Visit: Payer: Self-pay

## 2018-12-31 ENCOUNTER — Ambulatory Visit (INDEPENDENT_AMBULATORY_CARE_PROVIDER_SITE_OTHER): Payer: Self-pay

## 2018-12-31 DIAGNOSIS — E538 Deficiency of other specified B group vitamins: Secondary | ICD-10-CM

## 2018-12-31 MED ORDER — CYANOCOBALAMIN 1000 MCG/ML IJ SOLN
1000.0000 ug | Freq: Once | INTRAMUSCULAR | Status: AC
  Administered 2018-12-31: 1000 ug via INTRAMUSCULAR

## 2018-12-31 NOTE — Progress Notes (Signed)
Per orders of Dr. Sarajane Jews, 1st injection of B12 given by Franco Collet. Patient tolerated injection well.

## 2019-01-06 ENCOUNTER — Other Ambulatory Visit: Payer: Self-pay

## 2019-01-07 ENCOUNTER — Ambulatory Visit (INDEPENDENT_AMBULATORY_CARE_PROVIDER_SITE_OTHER): Payer: Self-pay | Admitting: *Deleted

## 2019-01-07 DIAGNOSIS — E538 Deficiency of other specified B group vitamins: Secondary | ICD-10-CM

## 2019-01-07 MED ORDER — CYANOCOBALAMIN 1000 MCG/ML IJ SOLN
1000.0000 ug | Freq: Once | INTRAMUSCULAR | Status: AC
  Administered 2019-01-07: 1000 ug via INTRAMUSCULAR

## 2019-01-07 NOTE — Progress Notes (Signed)
Per orders of Dr. Sarajane Jews, injection of Vitamin B 12 given by Zacarias Pontes. Patient tolerated injection well.

## 2019-01-10 ENCOUNTER — Telehealth: Payer: Self-pay

## 2019-01-10 NOTE — Telephone Encounter (Signed)
Copied from [place] [id number]. Topic: General - Inquiry >> Dec 29, 2018 11:17 AM Virl Axe D wrote: Reason for CRM: Pt stated she needs her most recent lab results and colonoscopy results faxed to her GI office. Please advise. Stated she discussed the colonoscopy with Dr. Sarajane Jews at last appt Dr. Gretta Began [phone] Attn: Vaughan Basta

## 2019-01-11 ENCOUNTER — Telehealth: Payer: Self-pay

## 2019-01-11 NOTE — Telephone Encounter (Signed)
B12  $10 med [id number] & $115 admin [id number] =$125

## 2019-01-11 NOTE — Telephone Encounter (Signed)
Do you know the price of this

## 2019-01-11 NOTE — Telephone Encounter (Signed)
Spoke with the patient. She stated she has a nurse visit here on Friday and will sign a record release then.

## 2019-01-11 NOTE — Telephone Encounter (Signed)
Patient is aware 

## 2019-01-11 NOTE — Telephone Encounter (Signed)
Patient stated that she has been getting B12 injections here in the office. She is self pay and would like to know the cost of this so that she may pay while in the office.

## 2019-01-13 ENCOUNTER — Other Ambulatory Visit: Payer: Self-pay

## 2019-01-14 ENCOUNTER — Ambulatory Visit (INDEPENDENT_AMBULATORY_CARE_PROVIDER_SITE_OTHER): Payer: Self-pay

## 2019-01-14 ENCOUNTER — Ambulatory Visit: Payer: Self-pay

## 2019-01-14 DIAGNOSIS — E538 Deficiency of other specified B group vitamins: Secondary | ICD-10-CM

## 2019-01-14 MED ORDER — CYANOCOBALAMIN 1000 MCG/ML IJ SOLN
1000.0000 ug | Freq: Once | INTRAMUSCULAR | Status: AC
  Administered 2019-01-14: 1000 ug via INTRAMUSCULAR

## 2019-01-14 NOTE — Progress Notes (Signed)
Per orders of Dr. Sarajane Jews , 3rd injection of B12 given by Franco Collet. Patient tolerated injection well.  Pt spouse was shown step-by-step instructions for B12 IM administration. Pt verbalized understanding.

## 2019-01-14 NOTE — Patient Instructions (Signed)
Health Maintenance Due  Topic Date Due  . HIV Screening  11/27/1991  . TETANUS/TDAP  11/27/1995  . PAP SMEAR-Modifier  11/26/1997  . INFLUENZA VACCINE  08/28/2018    No flowsheet data found.

## 2019-01-17 MED ORDER — ALCOHOL PADS 70 % PADS: MEDICATED_PAD | 0 refills | Status: DC

## 2019-01-17 MED ORDER — "BD DISP NEEDLE TW 18G X 1"" MISC": 0 refills | Status: DC

## 2019-01-17 MED ORDER — "BD ECLIPSE SYRINGE 25G X 1"" 3 ML MISC": 0 refills | Status: DC

## 2019-01-17 MED ORDER — CYANOCOBALAMIN 1000 MCG/ML IJ SOLN: INTRAMUSCULAR | 3 refills | Status: DC

## 2019-04-22 ENCOUNTER — Other Ambulatory Visit: Payer: Self-pay | Admitting: Family Medicine

## 2019-05-15 IMAGING — CT CT RENAL STONE PROTOCOL
2 of 4 series · 16 of 46 positions shown, 18 images · non-contrast
Comparison: Right upper quadrant ultrasound 8991.

CLINICAL DATA: Left flank pain.

EXAM:
CT ABDOMEN AND PELVIS WITHOUT CONTRAST
TECHNIQUE: Multidetector CT imaging of the abdomen and pelvis was performed
following the standard protocol without IV contrast.

[Series 3: ap without · axial · non-contrast · 0.70mm/px · z∈[+855,+1220]mm · 13 of 83 slices shown, 15 images]
[im 5/83  soft-tissue]
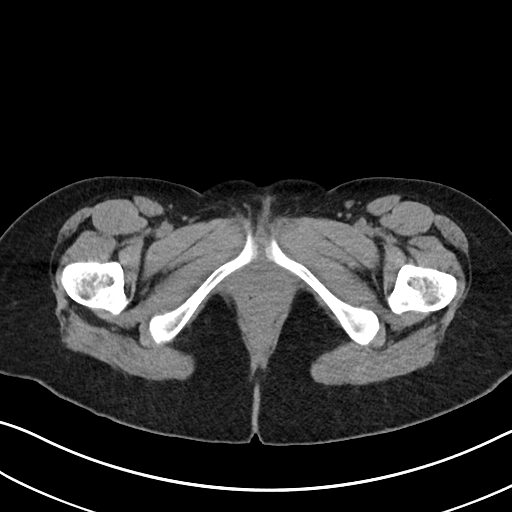
[im 5/83  bone]
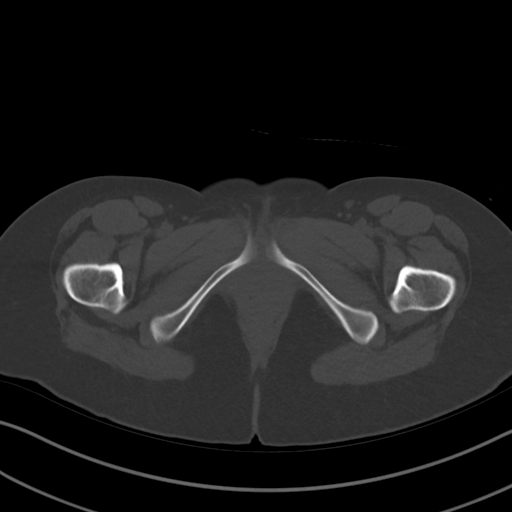
[im 10/83  soft-tissue]
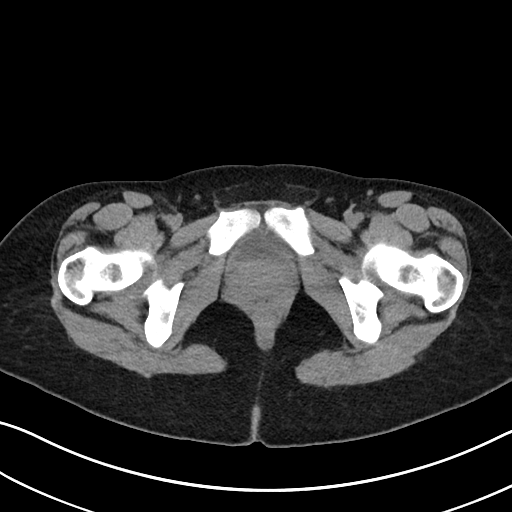
[im 19/83  soft-tissue]
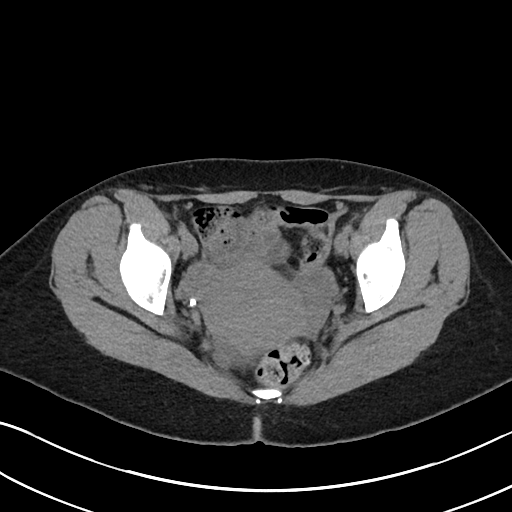
[im 23/83  soft-tissue]
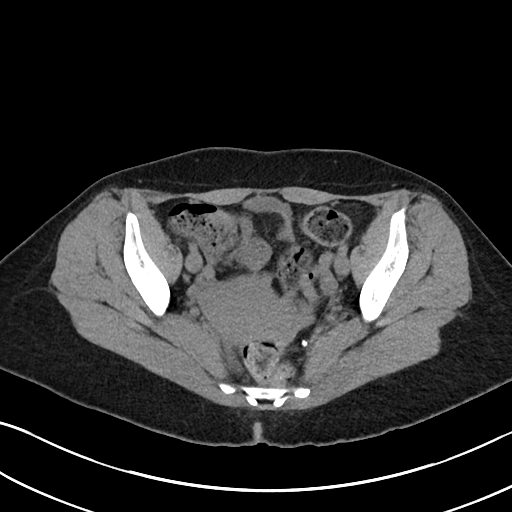
[im 28/83  soft-tissue]
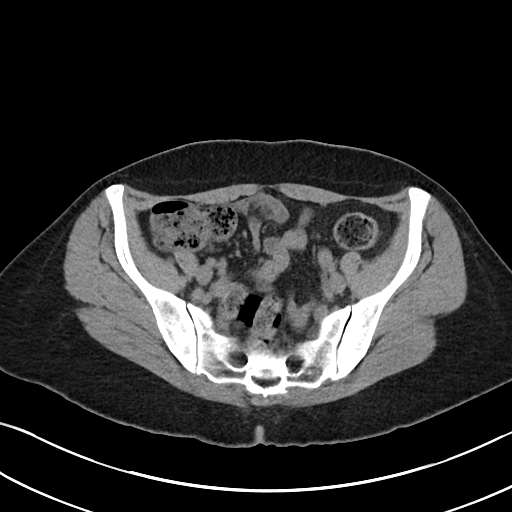
[im 37/83  soft-tissue]
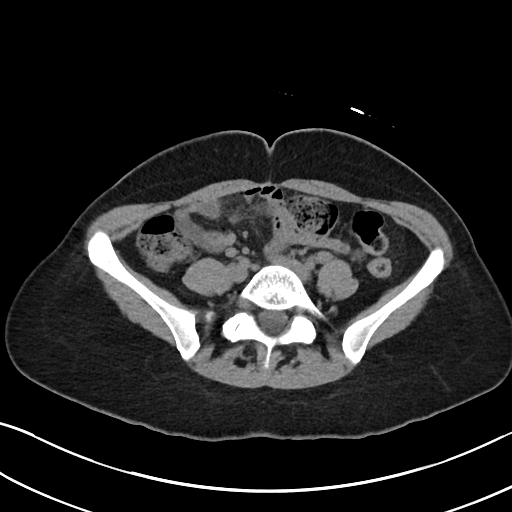
[im 42/83  soft-tissue]
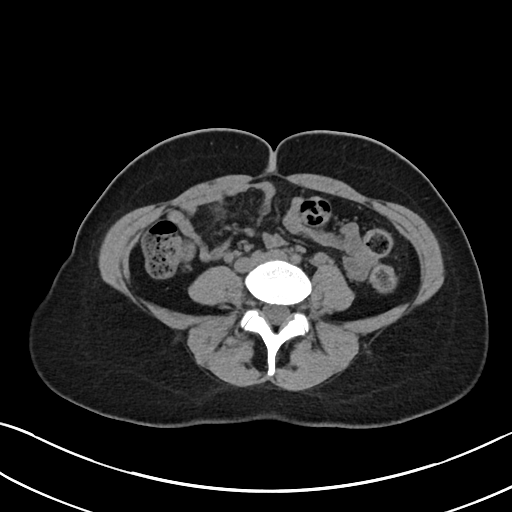
[im 46/83  soft-tissue]
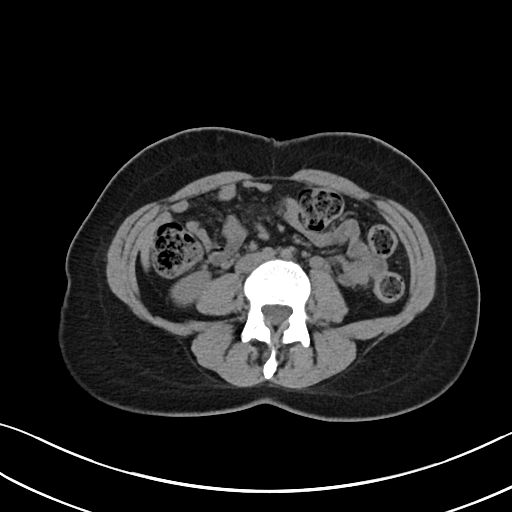
[im 55/83  soft-tissue]
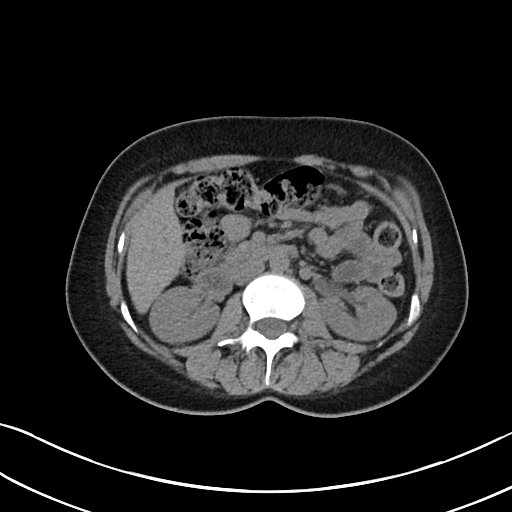
[im 55/83  bone]
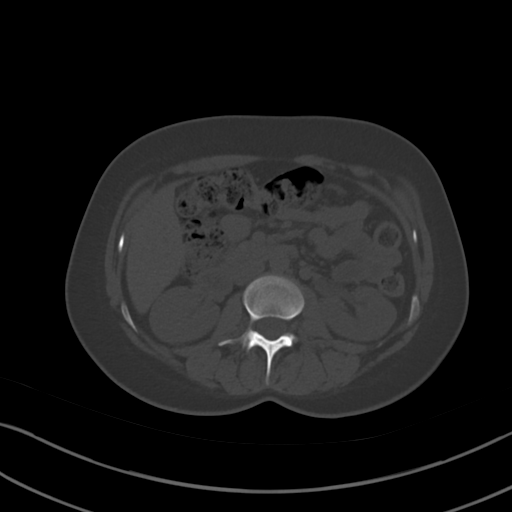
[im 60/83  soft-tissue]
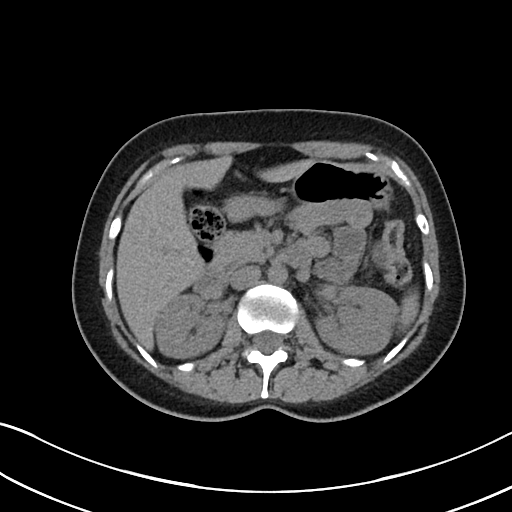
[im 64/83  soft-tissue]
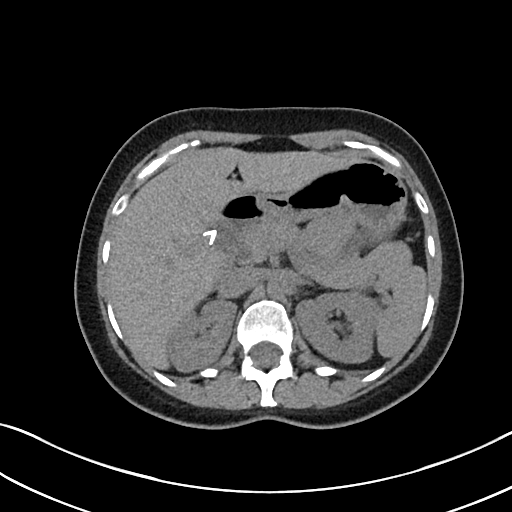
[im 73/83  soft-tissue]
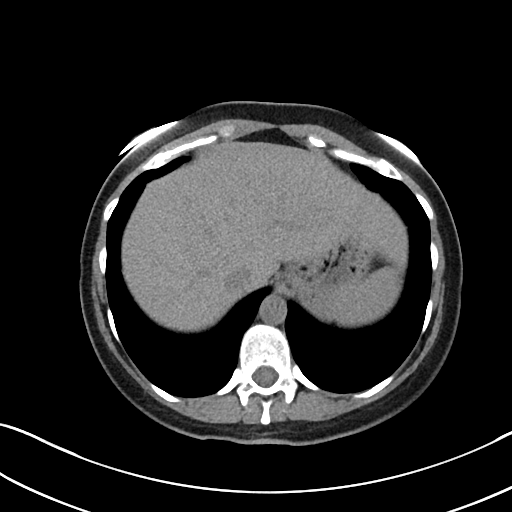
[im 78/83  soft-tissue]
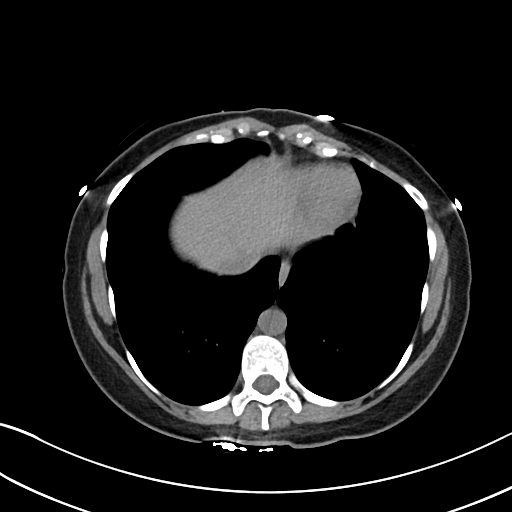

[Series 6: cor · coronal · 0.65mm/px · 3 of 70 slices shown]
[im 24/70  soft-tissue]
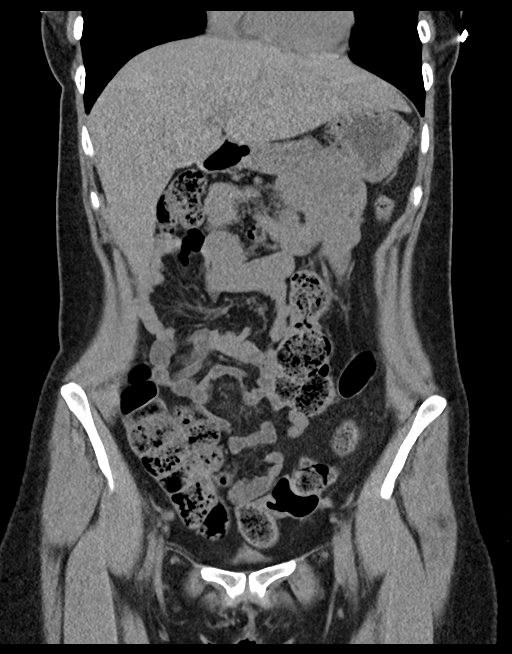
[im 31/70  soft-tissue]
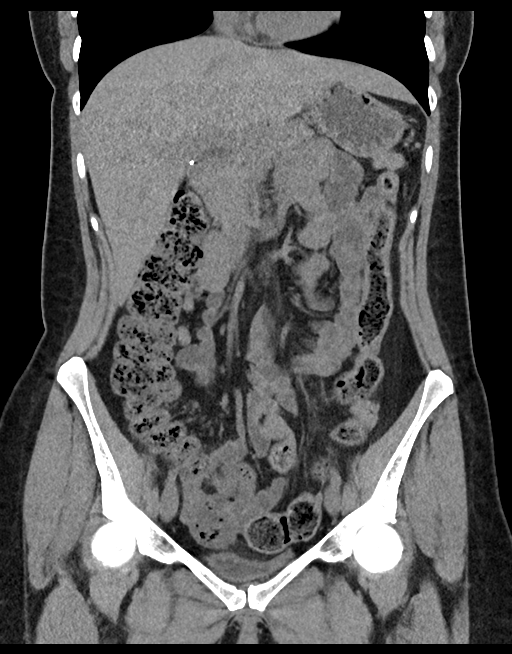
[im 39/70  soft-tissue]
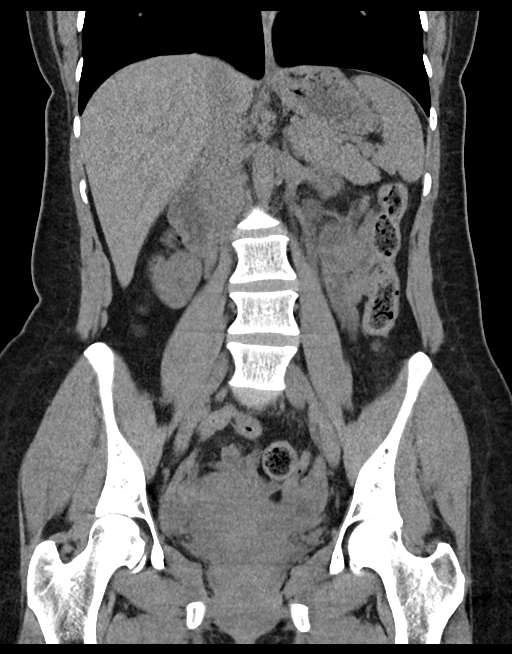

[16 of 46 positions shown; findings below may reference images not displayed]

FINDINGS: Lower chest: No acute abnormality.

Hepatobiliary: No focal liver abnormality is seen. Status post
cholecystectomy. No biliary dilatation.

Pancreas: Unremarkable. No pancreatic ductal dilatation or
surrounding inflammatory changes.

Spleen: Normal in size without focal abnormality.

Adrenals/Urinary Tract: The adrenal glands are unremarkable. There
is a 2 mm calculus at the left UVJ. Minimal left
hydroureteronephrosis. Punctate nonobstructive right renal calculus.
The bladder is decompressed.

Stomach/Bowel: Stomach is within normal limits. Appendix is not
definitively identified, however there are no secondary signs of
inflammation in the right lower quadrant. No evidence of bowel wall
thickening, distention, or inflammatory changes.

Vascular/Lymphatic: No significant vascular findings are present. No
enlarged abdominal or pelvic lymph nodes.

Reproductive: Uterus and bilateral adnexa are unremarkable. Prior
tubal ligation.

Other: Trace free fluid in the pelvis, likely physiologic. No
pneumoperitoneum.

Musculoskeletal: No acute or significant osseous findings.
IMPRESSION: 1. Tiny 2 mm calculus at the left UVJ with minimal left
hydroureteronephrosis.
2. Punctate nonobstructive right nephrolithiasis.

## 2022-03-20 ENCOUNTER — Other Ambulatory Visit: Payer: Self-pay | Admitting: Obstetrics and Gynecology

## 2022-03-20 DIAGNOSIS — N6310 Unspecified lump in the right breast, unspecified quadrant: Secondary | ICD-10-CM

## 2022-04-07 ENCOUNTER — Ambulatory Visit
Admission: RE | Admit: 2022-04-07 | Discharge: 2022-04-07 | Disposition: A | Discharge status: Home / Self Care | Payer: Self-pay | Source: Ambulatory Visit | Attending: Obstetrics and Gynecology | Admitting: Obstetrics and Gynecology

## 2022-04-07 ENCOUNTER — Ambulatory Visit
Admission: RE | Admit: 2022-04-07 | Discharge: 2022-04-07 | Disposition: A | Discharge status: Home / Self Care | Payer: 59 | Source: Ambulatory Visit | Attending: Obstetrics and Gynecology | Admitting: Obstetrics and Gynecology

## 2022-04-07 DIAGNOSIS — N6310 Unspecified lump in the right breast, unspecified quadrant: Secondary | ICD-10-CM

## 2022-05-02 ENCOUNTER — Other Ambulatory Visit: Payer: Self-pay

## 2023-04-28 HISTORY — PX: OTHER SURGICAL HISTORY: SHX169

## 2023-08-05 ENCOUNTER — Encounter (HOSPITAL_COMMUNITY): Payer: Self-pay | Admitting: Obstetrics and Gynecology

## 2023-08-06 ENCOUNTER — Encounter (HOSPITAL_COMMUNITY): Payer: Self-pay | Admitting: Obstetrics and Gynecology

## 2023-08-06 NOTE — Pre-Procedure Instructions (Signed)
 Surgical Instructions   Your procedure is scheduled on :  Thursday, 08-13-2023. Report to [facility] Main Entrance A at 5:30  A.M., then check in with the Admitting office. Any questions or running late day of surgery: call [phone]  Questions prior to your surgery date: call [phone], Monday-Friday, 8am-4pm. If you experience any cold or flu symptoms such as cough, fever, chills, shortness of breath, etc. between now and your scheduled surgery, please notify your surgeon office.    Remember:  Do not eat any food and do not drink any liquids after midnight the night before your surgery.  This includes not water,  candy,  gum,  and  mints.   Take these medicines the morning of surgery with A SIP OF WATER :  none   May take these medicines IF NEEDED: Abreva   One week prior to surgery, STOP taking any Aspirin (unless otherwise instructed by your surgeon) Aleve, Naproxen, Ibuprofen , Motrin , Advil , Goody's, BC's, all herbal medications, fish oil, and non-prescription vitamins.                     Do NOT Smoke (Tobacco/Vaping) and Do Not drink alcohol  for 24 hours prior to your procedure.  If you use a CPAP at night, you may bring your mask/headgear for your overnight stay.   You will be asked to remove any contacts, glasses, piercing's, hearing aid's, dentures/partials prior to surgery. Please bring cases / contact case & solution for these items if needed.    Patients discharged the day of surgery will not be allowed to drive home, and someone needs to stay with them for 24 hours.  SURGICAL WAITING ROOM VISITATION Patients may have no more than 2 support people in the waiting area - these visitors may rotate.   Pre-op nurse will coordinate an appropriate time for 1 ADULT support person, who may not rotate, to accompany patient in pre-op.  Children under the age of 17 must have an adult with them who is not the patient and must remain in the main waiting area with an  adult.  If the patient needs to stay at the hospital during part of their recovery, the visitor guidelines for inpatient rooms apply.  Please refer to the [facility] website for the visitor guidelines for any additional information.   If you received a COVID test during your pre-op visit  it is requested that you wear a mask when out in public, stay away from anyone that may not be feeling well and notify your surgeon if you develop symptoms. If you have been in contact with anyone that has tested positive in the last 10 days please notify you surgeon.      Pre-operative CHG Bathing Instructions   You can play a key role in reducing the risk of infection after surgery. Your skin needs to be as free of germs as possible. You can reduce the number of germs on your skin by washing with CHG (chlorhexidine gluconate) soap before surgery. CHG is an antiseptic soap that kills germs and continues to kill germs even after washing.   DO NOT use if you have an allergy to chlorhexidine/CHG or antibacterial soaps. If your skin becomes reddened or irritated, stop using the CHG and notify Pre-op nurse day of surgery.             TAKE A SHOWER THE NIGHT BEFORE SURGERY AND THE DAY OF SURGERY    Please keep in mind the following:  DO NOT shave, including legs and underarms, 48 hours prior to surgery.   You may shave your face before/day of surgery.  Place clean sheets on your bed the night before surgery Use a clean washcloth (not used since being washed) for each shower. DO NOT sleep with pet's night before surgery.  CHG Shower Instructions:  Wash your face and private area with normal soap. If you choose to wash your hair, wash first with your normal shampoo.  After you use shampoo/soap, rinse your hair and body thoroughly to remove shampoo/soap residue.  Turn the water OFF and apply half the bottle of CHG soap to a CLEAN washcloth.  Apply CHG soap ONLY FROM YOUR NECK DOWN TO YOUR TOES (washing for  3-5 minutes)  DO NOT use CHG soap on face, private areas, open wounds, or sores.  Pay special attention to the area where your surgery is being performed.  If you are having back surgery, having someone wash your back for you may be helpful. Wait 2 minutes after CHG soap is applied, then you may rinse off the CHG soap.  Pat dry with a clean towel  Put on clean pajamas    Additional instructions for the day of surgery: DO NOT APPLY any lotions,  powder,  oils,  deodorants (may use underarm deodorant) , cologne/  perfumes  or makeup Do not wear jewelry /  piercing's/  metal/  permanent jewelry must be removed prior to surgery day of surgery.  (No plastic piercing) Do not wear nail polish, gel polish, artificial nails, or any other type of covering on natural finger nails (toe nails are okay) Do not bring valuables to the hospital. [facility] is not responsible for valuables/personal belongings. Put on clean/comfortable clothes.  Please brush your teeth.  Ask your nurse before applying any prescription medications to the skin.

## 2023-08-06 NOTE — Progress Notes (Signed)
 Spoke w/ via phone for pre-op interview--- pt Lab needs dos----  upt (per anes)       Lab results------ lab appt 08-10-2023 @ 1130 getting CBC/ T&S (per anes) COVID test -----patient states asymptomatic no test needed Arrive at ------- 0530 on 08-13-2023 NPO after MN w/ exception sips of water w/ meds Pre-Surgery Ensure or G2:  Med rec completed Medications to take morning of surgery ----- none Diabetic medication -----  GLP1 agonist last dose: GLP1 instructions:  Patient instructed no nail polish to be worn day of surgery Patient instructed to bring photo id and insurance card day of surgery Patient aware to have Driver (ride ) / caregiver    for 24 hours after surgery -  husband, terry Protzman Patient Special Instructions ----- will pick up bag w/ soap and written instructions at lab appt Pre-Op special Instructions ----- sent inbox message to Dr Sudie in epic on 08-05-2023,  requested pre-op orders.  Patient verbalized understanding of instructions that were given at this phone interview. Patient denies chest pain, sob, fever, cough at the interview.

## 2023-08-10 ENCOUNTER — Encounter (HOSPITAL_COMMUNITY)
Admission: RE | Admit: 2023-08-10 | Discharge: 2023-08-10 | Disposition: A | Discharge status: Home / Self Care | Source: Ambulatory Visit | Attending: Obstetrics and Gynecology | Admitting: Obstetrics and Gynecology

## 2023-08-10 DIAGNOSIS — Z01812 Encounter for preprocedural laboratory examination: Secondary | ICD-10-CM | POA: Insufficient documentation

## 2023-08-10 DIAGNOSIS — Z01818 Encounter for other preprocedural examination: Secondary | ICD-10-CM

## 2023-08-10 LAB — TYPE AND SCREEN
ABO/RH(D): A POS
Antibody Screen: NEGATIVE

## 2023-08-10 LAB — CBC
HCT: 38.9 % (ref 36.0–46.0)
Hemoglobin: 12.8 g/dL (ref 12.0–15.0)
MCH: 26.9 pg (ref 26.0–34.0)
MCHC: 32.9 g/dL (ref 30.0–36.0)
MCV: 81.7 fL (ref 80.0–100.0)
Platelets: 308 K/uL (ref 150–400)
RBC: 4.76 MIL/uL (ref 3.87–5.11)
RDW: 15.2 % (ref 11.5–15.5)
WBC: 5.3 K/uL (ref 4.0–10.5)
nRBC: 0 % (ref 0.0–0.2)

## 2023-08-12 NOTE — H&P (Signed)
 Janet Myers is an 48 y.o. female presenting for scheduled surgery.   Pertinent Gynecological History: Menses: regular with significant cramping and heavy bleeding CD1-4 Bleeding: dysfunctional uterine bleeding Contraception: tubal ligation DES exposure: denies Blood transfusions: none Sexually transmitted diseases: no past history Previous GYN Procedures: none  Last mammogram: normal Date: 04/07/22 Last pap: normal Date: 03/18/22 OB History: G2, P2002. SVD then pLTCS   Menstrual History: Menarche age: [age]yo Patient's last menstrual period was 07/12/2023 (exact date).    Past Medical History:  Diagnosis Date   Abnormal uterine bleeding (AUB)    Anemia    Episodic cluster headache, not intractable    previous neruology-- Dr. Darice Shivers   History of kidney stones    IBS (irritable bowel syndrome)    Uterine leiomyoma    Vertigo    Wears contact lenses     Past Surgical History:  Procedure Laterality Date   CESAREAN SECTION  05/07/2009   @ [facility] by dr v. raeanne   COLONOSCOPY WITH PROPOFOL   07/03/2004   per Dr. Avram, clear    LAPAROSCOPIC CHOLECYSTECTOMY  11/17/2011   @ [facility] by dr a. rubin   LAPAROSCOPIC TUBAL LIGATION  04/21/2011   Procedure: LAPAROSCOPIC TUBAL LIGATION;  Surgeon: Truman Corona, MD;  Location: [facility];  Service: Gynecology;  Laterality: Bilateral;  Bilateral with Filshie Clips   STRABISMUS SURGERY Left    child   and  2013    Family History  Problem Relation Age of Onset   Depression Mother    Hepatitis Mother        c   Osteopenia Mother    Alcohol  abuse Father    COPD Father    Heart disease Father    Cirrhosis Father    Hepatitis Father        c   Osteopenia Father     Social History:  reports that she has never smoked. She has never used smokeless tobacco. She reports current alcohol  use. She reports that she does not use drugs.  Allergies:  Allergies  Allergen Reactions   Codeine Nausea And Vomiting   Hydrocodone  Nausea And Vomiting   Oxycodone  Nausea And Vomiting    No medications prior to admission.    Review of Systems  Constitutional:  Negative for chills and fever.  Respiratory:  Negative for shortness of breath.   Cardiovascular:  Negative for chest pain, palpitations and leg swelling.  Gastrointestinal:  Negative for abdominal pain, nausea and vomiting.  Genitourinary:  Positive for pelvic pain.  Neurological:  Negative for dizziness, weakness and headaches.  Psychiatric/Behavioral:  Negative for suicidal ideas.     Height 5' 1 (1.549 m), weight 54.4 kg, last menstrual period 07/12/2023. Physical Exam Gen: NAD CV: CTAB, RRR Abd: lap sites x3, Pfannenstiel incision, mild TTP BL LQ GU deferred MSK: Neg calf TTP/Homans BL Psych/Neuro WNL  EMBXx: 4/11 - early to mid secretory endo, negative for atypia/yperplasia TUVS 4/11: Uterus 9.6x7.6x5.7cm, EMS 12.94mm retroverted with multiple fibroids (largest our measured with largets 4cm), lf ov with cyst x2 (hemorrhagic vs endometrioma) No results found for this or any previous visit (from the past 24 hours).  No results found.  Assessment/Plan: This is a 53bn [id number] s/p BTL (Filshie) presenting for scheduled procedure. Here for surgical management of prolapse symptoms and AUB in the form of hysterectomy. H/o csx x1  -EMBx today, uncomplicated  Given h/o painful periods, will schedule for LAVH/BS/cysto, possible anterior/posterior colporraphy.  Risks of hysterectomy include infection of the  uterus, pelvic organs, or skin, inadvertent injury to internal organs, such as bowel or bladder. If there is major injury, extensive surgery may be required. If injury is minor, it may be treated with relative ease. Discussed possibility of excessive blood loss and transfusion. Patient aware that no future fertility will remain after procedure. Patient accepts the possibility of blood transfusion, if necessary. Bowel and/or bladder injury may require  prolonged inpatient stay and possible colostomy, Foley catheter, etc, as deemed fit by other surgeon. Patient understands and agrees to move forward with surgery.   Janet Myers 08/12/2023, 8:34 PM

## 2023-08-12 NOTE — Anesthesia Preprocedure Evaluation (Signed)
 Anesthesia Evaluation  Patient identified by MRN, date of birth, ID band Patient awake    Reviewed: Allergy & Precautions, H&P , NPO status , Patient's Chart, lab work & pertinent test results  Airway Mallampati: I  TM Distance: >3 FB Neck ROM: Full    Dental no notable dental hx. (+) Teeth Intact, Caps, Dental Advisory Given   Pulmonary neg pulmonary ROS   Pulmonary exam normal breath sounds clear to auscultation       Cardiovascular Exercise Tolerance: Good negative cardio ROS Normal cardiovascular exam Rhythm:Regular Rate:Normal     Neuro/Psych  Headaches negative neurological ROS  negative psych ROS   GI/Hepatic negative GI ROS, Neg liver ROS,,,  Endo/Other  negative endocrine ROS    Renal/GU negative Renal ROS  negative genitourinary   Musculoskeletal negative musculoskeletal ROS (+)    Abdominal   Peds negative pediatric ROS (+)  Hematology negative hematology ROS (+) Blood dyscrasia, anemia   Anesthesia Other Findings   Reproductive/Obstetrics negative OB ROS                              Anesthesia Physical Anesthesia Plan  ASA: 2  Anesthesia Plan: General   Post-op Pain Management: Tylenol  PO (pre-op)*, Celebrex  PO (pre-op)* and Gabapentin  PO (pre-op)*   Induction: Intravenous  PONV Risk Score and Plan: 3 and Ondansetron , Dexamethasone , Midazolam  and Treatment may vary due to age or medical condition  Airway Management Planned: Oral ETT  Additional Equipment:   Intra-op Plan:   Post-operative Plan: Extubation in OR  Informed Consent: I have reviewed the patients History and Physical, chart, labs and discussed the procedure including the risks, benefits and alternatives for the proposed anesthesia with the patient or authorized representative who has indicated his/her understanding and acceptance.       Plan Discussed with: Anesthesiologist and  CRNA  Anesthesia Plan Comments: (  )         Anesthesia Quick Evaluation

## 2023-08-13 ENCOUNTER — Encounter (HOSPITAL_COMMUNITY): Payer: Self-pay | Admitting: Obstetrics and Gynecology

## 2023-08-13 ENCOUNTER — Ambulatory Visit (HOSPITAL_COMMUNITY)
Admission: RE | Admit: 2023-08-13 | Discharge: 2023-08-14 | Disposition: A | Discharge status: Home / Self Care | Attending: Obstetrics and Gynecology | Admitting: Obstetrics and Gynecology

## 2023-08-13 ENCOUNTER — Ambulatory Visit (HOSPITAL_COMMUNITY): Admitting: Anesthesiology

## 2023-08-13 ENCOUNTER — Encounter (HOSPITAL_COMMUNITY)
Admission: RE | Disposition: A | Discharge status: Home / Self Care | Payer: Self-pay | Source: Home / Self Care | Attending: Obstetrics and Gynecology

## 2023-08-13 ENCOUNTER — Other Ambulatory Visit: Payer: Self-pay

## 2023-08-13 DIAGNOSIS — D259 Leiomyoma of uterus, unspecified: Secondary | ICD-10-CM

## 2023-08-13 DIAGNOSIS — N8311 Corpus luteum cyst of right ovary: Secondary | ICD-10-CM | POA: Diagnosis not present

## 2023-08-13 DIAGNOSIS — N8003 Adenomyosis of the uterus: Secondary | ICD-10-CM | POA: Diagnosis not present

## 2023-08-13 DIAGNOSIS — N939 Abnormal uterine and vaginal bleeding, unspecified: Secondary | ICD-10-CM | POA: Diagnosis present

## 2023-08-13 DIAGNOSIS — Z01818 Encounter for other preprocedural examination: Secondary | ICD-10-CM

## 2023-08-13 HISTORY — DX: Irritable bowel syndrome, unspecified: K58.9

## 2023-08-13 HISTORY — PX: CYSTOSCOPY: SHX5120

## 2023-08-13 HISTORY — DX: Personal history of urinary calculi: Z87.442

## 2023-08-13 HISTORY — DX: Presence of spectacles and contact lenses: Z97.3

## 2023-08-13 HISTORY — DX: Leiomyoma of uterus, unspecified: D25.9

## 2023-08-13 HISTORY — DX: Episodic cluster headache, not intractable: G44.019

## 2023-08-13 HISTORY — PX: LAPAROSCOPIC VAGINAL HYSTERECTOMY WITH SALPINGECTOMY: SHX6680

## 2023-08-13 HISTORY — DX: Abnormal uterine and vaginal bleeding, unspecified: N93.9

## 2023-08-13 LAB — POCT PREGNANCY, URINE: Preg Test, Ur: NEGATIVE

## 2023-08-13 SURGERY — HYSTERECTOMY, VAGINAL, LAPAROSCOPY-ASSISTED, WITH SALPINGECTOMY
Anesthesia: General

## 2023-08-13 MED ORDER — CHLORHEXIDINE GLUCONATE 0.12 % MT SOLN
OROMUCOSAL | Status: AC
  Filled 2023-08-13: qty 15

## 2023-08-13 MED ORDER — ONDANSETRON HCL 4 MG/2ML IJ SOLN
INTRAMUSCULAR | Status: AC
  Filled 2023-08-13: qty 2

## 2023-08-13 MED ORDER — ONDANSETRON HCL 4 MG PO TABS: 4.0000 mg | ORAL_TABLET | Freq: Four times a day (QID) | ORAL | 0 refills | Status: AC | PRN

## 2023-08-13 MED ORDER — FENTANYL CITRATE (PF) 100 MCG/2ML IJ SOLN
INTRAMUSCULAR | Status: AC
Start: 2023-08-13 — End: 2023-08-13
  Filled 2023-08-13: qty 2

## 2023-08-13 MED ORDER — FLUORESCEIN SODIUM 10 % IV SOLN
INTRAVENOUS | Status: DC | PRN
Start: 2023-08-13 — End: 2023-08-13
  Administered 2023-08-13: 20 mg via INTRAVENOUS

## 2023-08-13 MED ORDER — OXYCODONE-ACETAMINOPHEN 5-325 MG PO TABS: 1.0000 | ORAL_TABLET | ORAL | Status: DC | PRN

## 2023-08-13 MED ORDER — ONDANSETRON HCL 4 MG/2ML IJ SOLN
4.0000 mg | Freq: Four times a day (QID) | INTRAMUSCULAR | Status: DC | PRN
  Administered 2023-08-13 (×2): 4 mg via INTRAVENOUS
  Filled 2023-08-13: qty 2

## 2023-08-13 MED ORDER — EPHEDRINE SULFATE-NACL 50-0.9 MG/10ML-% IV SOSY
PREFILLED_SYRINGE | INTRAVENOUS | Status: DC | PRN
Start: 2023-08-13 — End: 2023-08-13
  Administered 2023-08-13: 5 mg via INTRAVENOUS

## 2023-08-13 MED ORDER — FLUORESCEIN SODIUM 10 % IV SOLN
INTRAVENOUS | Status: AC
  Filled 2023-08-13: qty 5

## 2023-08-13 MED ORDER — SIMETHICONE 80 MG PO CHEW: 80.0000 mg | CHEWABLE_TABLET | Freq: Four times a day (QID) | ORAL | 0 refills | Status: AC | PRN

## 2023-08-13 MED ORDER — MEPERIDINE HCL 25 MG/ML IJ SOLN: 6.2500 mg | INTRAMUSCULAR | Status: DC | PRN

## 2023-08-13 MED ORDER — LIDOCAINE 2% (20 MG/ML) 5 ML SYRINGE
INTRAMUSCULAR | Status: AC
  Filled 2023-08-13: qty 5

## 2023-08-13 MED ORDER — PHENYLEPHRINE 80 MCG/ML (10ML) SYRINGE FOR IV PUSH (FOR BLOOD PRESSURE SUPPORT)
PREFILLED_SYRINGE | INTRAVENOUS | Status: AC
  Filled 2023-08-13: qty 10

## 2023-08-13 MED ORDER — FENTANYL CITRATE (PF) 100 MCG/2ML IJ SOLN
25.0000 ug | INTRAMUSCULAR | Status: DC | PRN
  Administered 2023-08-13: 25 ug via INTRAVENOUS
  Administered 2023-08-13: 50 ug via INTRAVENOUS
  Administered 2023-08-13: 25 ug via INTRAVENOUS

## 2023-08-13 MED ORDER — PROPOFOL 500 MG/50ML IV EMUL
INTRAVENOUS | Status: DC | PRN
Start: 2023-08-13 — End: 2023-08-13
  Administered 2023-08-13: 175 ug/kg/min via INTRAVENOUS

## 2023-08-13 MED ORDER — LACTATED RINGERS IV SOLN: INTRAVENOUS | Status: DC

## 2023-08-13 MED ORDER — TRANEXAMIC ACID-NACL 1000-0.7 MG/100ML-% IV SOLN
1000.0000 mg | Freq: Once | INTRAVENOUS | Status: AC
  Administered 2023-08-13: 1000 mg via INTRAVENOUS
  Filled 2023-08-13: qty 100

## 2023-08-13 MED ORDER — ACETAMINOPHEN 500 MG PO TABS
1000.0000 mg | ORAL_TABLET | ORAL | Status: AC
  Administered 2023-08-13: 1000 mg via ORAL

## 2023-08-13 MED ORDER — SODIUM CHLORIDE 0.9 % IR SOLN
Status: DC | PRN
  Administered 2023-08-13: 1000 mL

## 2023-08-13 MED ORDER — SODIUM CHLORIDE (PF) 0.9 % IJ SOLN
INTRAMUSCULAR | Status: AC
  Filled 2023-08-13: qty 100

## 2023-08-13 MED ORDER — ONDANSETRON HCL 4 MG PO TABS: 4.0000 mg | ORAL_TABLET | Freq: Four times a day (QID) | ORAL | Status: DC | PRN

## 2023-08-13 MED ORDER — VASOPRESSIN 20 UNIT/ML IV SOLN
INTRAVENOUS | Status: DC | PRN
  Administered 2023-08-13: 4 mL via INTRAMUSCULAR

## 2023-08-13 MED ORDER — SODIUM CHLORIDE (PF) 0.9 % IJ SOLN
INTRAMUSCULAR | Status: AC
  Filled 2023-08-13: qty 10

## 2023-08-13 MED ORDER — ACETAMINOPHEN 500 MG PO TABS
1000.0000 mg | ORAL_TABLET | Freq: Four times a day (QID) | ORAL | Status: DC
  Administered 2023-08-13 – 2023-08-14 (×3): 1000 mg via ORAL
  Filled 2023-08-13 (×3): qty 2

## 2023-08-13 MED ORDER — KETOROLAC TROMETHAMINE 30 MG/ML IJ SOLN
30.0000 mg | Freq: Once | INTRAMUSCULAR | Status: AC
  Administered 2023-08-13: 30 mg via INTRAVENOUS

## 2023-08-13 MED ORDER — PROPOFOL 10 MG/ML IV BOLUS
INTRAVENOUS | Status: AC
  Filled 2023-08-13: qty 20

## 2023-08-13 MED ORDER — CELECOXIB 200 MG PO CAPS
200.0000 mg | ORAL_CAPSULE | Freq: Once | ORAL | Status: AC
  Administered 2023-08-13: 200 mg via ORAL

## 2023-08-13 MED ORDER — IBUPROFEN 800 MG PO TABS: 800.0000 mg | ORAL_TABLET | Freq: Three times a day (TID) | ORAL | 1 refills | Status: AC

## 2023-08-13 MED ORDER — SOD CITRATE-CITRIC ACID 500-334 MG/5ML PO SOLN: 30.0000 mL | ORAL | Status: DC

## 2023-08-13 MED ORDER — FENTANYL CITRATE (PF) 250 MCG/5ML IJ SOLN
INTRAMUSCULAR | Status: AC
  Filled 2023-08-13: qty 5

## 2023-08-13 MED ORDER — AMISULPRIDE (ANTIEMETIC) 5 MG/2ML IV SOLN
INTRAVENOUS | Status: AC
  Filled 2023-08-13: qty 4

## 2023-08-13 MED ORDER — OXYCODONE HCL 5 MG PO TABS: 5.0000 mg | ORAL_TABLET | Freq: Once | ORAL | Status: DC | PRN

## 2023-08-13 MED ORDER — ONDANSETRON HCL 4 MG/2ML IJ SOLN
4.0000 mg | Freq: Once | INTRAMUSCULAR | Status: AC | PRN
  Administered 2023-08-13: 4 mg via INTRAVENOUS

## 2023-08-13 MED ORDER — IBUPROFEN 600 MG PO TABS
600.0000 mg | ORAL_TABLET | Freq: Four times a day (QID) | ORAL | Status: DC
  Administered 2023-08-14: 600 mg via ORAL
  Filled 2023-08-13 (×2): qty 1

## 2023-08-13 MED ORDER — OXYCODONE HCL 5 MG/5ML PO SOLN: 5.0000 mg | Freq: Once | ORAL | Status: DC | PRN

## 2023-08-13 MED ORDER — HYDROMORPHONE HCL 1 MG/ML IJ SOLN
INTRAMUSCULAR | Status: AC
  Filled 2023-08-13: qty 0.5

## 2023-08-13 MED ORDER — KETOROLAC TROMETHAMINE 30 MG/ML IJ SOLN
30.0000 mg | Freq: Four times a day (QID) | INTRAMUSCULAR | Status: DC
  Administered 2023-08-13 (×2): 30 mg via INTRAVENOUS
  Filled 2023-08-13 (×3): qty 1

## 2023-08-13 MED ORDER — DEXMEDETOMIDINE HCL IN NACL 80 MCG/20ML IV SOLN
INTRAVENOUS | Status: DC | PRN
Start: 2023-08-13 — End: 2023-08-13
  Administered 2023-08-13: 10 ug via INTRAVENOUS

## 2023-08-13 MED ORDER — SIMETHICONE 80 MG PO CHEW: 80.0000 mg | CHEWABLE_TABLET | Freq: Four times a day (QID) | ORAL | Status: DC | PRN

## 2023-08-13 MED ORDER — MIDAZOLAM HCL 2 MG/2ML IJ SOLN
INTRAMUSCULAR | Status: DC | PRN
  Administered 2023-08-13: 2 mg via INTRAVENOUS

## 2023-08-13 MED ORDER — KETOROLAC TROMETHAMINE 30 MG/ML IJ SOLN
INTRAMUSCULAR | Status: AC
  Filled 2023-08-13: qty 1

## 2023-08-13 MED ORDER — FENTANYL CITRATE (PF) 250 MCG/5ML IJ SOLN
INTRAMUSCULAR | Status: DC | PRN
  Administered 2023-08-13 (×2): 50 ug via INTRAVENOUS
  Administered 2023-08-13: 100 ug via INTRAVENOUS
  Administered 2023-08-13: 50 ug via INTRAVENOUS

## 2023-08-13 MED ORDER — CEFAZOLIN SODIUM-DEXTROSE 2-4 GM/100ML-% IV SOLN
INTRAVENOUS | Status: AC
  Filled 2023-08-13: qty 100

## 2023-08-13 MED ORDER — CHLORHEXIDINE GLUCONATE 0.12 % MT SOLN
15.0000 mL | Freq: Once | OROMUCOSAL | Status: AC
  Administered 2023-08-13: 15 mL via OROMUCOSAL

## 2023-08-13 MED ORDER — DEXAMETHASONE SODIUM PHOSPHATE 10 MG/ML IJ SOLN
INTRAMUSCULAR | Status: AC
  Filled 2023-08-13: qty 1

## 2023-08-13 MED ORDER — 0.9 % SODIUM CHLORIDE (POUR BTL) OPTIME
TOPICAL | Status: DC | PRN
Start: 2023-08-13 — End: 2023-08-13
  Administered 2023-08-13: 1000 mL

## 2023-08-13 MED ORDER — CEFAZOLIN SODIUM-DEXTROSE 2-4 GM/100ML-% IV SOLN
2.0000 g | INTRAVENOUS | Status: AC
  Administered 2023-08-13: 2 g via INTRAVENOUS

## 2023-08-13 MED ORDER — GABAPENTIN 300 MG PO CAPS
300.0000 mg | ORAL_CAPSULE | ORAL | Status: AC
  Administered 2023-08-13: 300 mg via ORAL

## 2023-08-13 MED ORDER — BUPIVACAINE HCL (PF) 0.25 % IJ SOLN
INTRAMUSCULAR | Status: DC | PRN
  Administered 2023-08-13: 5 mL

## 2023-08-13 MED ORDER — PROPOFOL 10 MG/ML IV BOLUS
INTRAVENOUS | Status: DC | PRN
  Administered 2023-08-13: 170 mg via INTRAVENOUS

## 2023-08-13 MED ORDER — SUGAMMADEX SODIUM 200 MG/2ML IV SOLN
INTRAVENOUS | Status: DC | PRN
  Administered 2023-08-13: 200 mg via INTRAVENOUS

## 2023-08-13 MED ORDER — CELECOXIB 200 MG PO CAPS
ORAL_CAPSULE | ORAL | Status: AC
  Filled 2023-08-13: qty 1

## 2023-08-13 MED ORDER — ACETAMINOPHEN 500 MG PO TABS
ORAL_TABLET | ORAL | Status: AC
  Filled 2023-08-13: qty 2

## 2023-08-13 MED ORDER — POVIDONE-IODINE 10 % EX SWAB
2.0000 | Freq: Once | CUTANEOUS | Status: AC
  Administered 2023-08-13: 2 via TOPICAL

## 2023-08-13 MED ORDER — VASOPRESSIN 20 UNIT/ML IV SOLN
INTRAVENOUS | Status: AC
  Filled 2023-08-13: qty 1

## 2023-08-13 MED ORDER — DEXMEDETOMIDINE HCL IN NACL 80 MCG/20ML IV SOLN
INTRAVENOUS | Status: AC
  Filled 2023-08-13: qty 20

## 2023-08-13 MED ORDER — POLYETHYLENE GLYCOL 3350 17 G PO PACK
17.0000 g | PACK | Freq: Every day | ORAL | Status: DC | PRN
Start: 2023-08-13 — End: 2023-08-14

## 2023-08-13 MED ORDER — HYDROMORPHONE HCL 1 MG/ML IJ SOLN
INTRAMUSCULAR | Status: DC | PRN
  Administered 2023-08-13: .5 mg via INTRAVENOUS

## 2023-08-13 MED ORDER — LIDOCAINE 2% (20 MG/ML) 5 ML SYRINGE
INTRAMUSCULAR | Status: DC | PRN
Start: 2023-08-13 — End: 2023-08-13
  Administered 2023-08-13: 60 mg via INTRAVENOUS

## 2023-08-13 MED ORDER — GABAPENTIN 300 MG PO CAPS
ORAL_CAPSULE | ORAL | Status: AC
  Filled 2023-08-13: qty 1

## 2023-08-13 MED ORDER — ROCURONIUM BROMIDE 10 MG/ML (PF) SYRINGE
PREFILLED_SYRINGE | INTRAVENOUS | Status: DC | PRN
  Administered 2023-08-13: 50 mg via INTRAVENOUS
  Administered 2023-08-13: 10 mg via INTRAVENOUS
  Administered 2023-08-13: 20 mg via INTRAVENOUS

## 2023-08-13 MED ORDER — DOCUSATE SODIUM 100 MG PO CAPS
100.0000 mg | ORAL_CAPSULE | Freq: Two times a day (BID) | ORAL | Status: DC
  Administered 2023-08-13 – 2023-08-14 (×2): 100 mg via ORAL
  Filled 2023-08-13 (×2): qty 1

## 2023-08-13 MED ORDER — OXYCODONE-ACETAMINOPHEN 5-325 MG PO TABS: 1.0000 | ORAL_TABLET | Freq: Four times a day (QID) | ORAL | 0 refills | Status: AC | PRN

## 2023-08-13 MED ORDER — ONDANSETRON HCL 4 MG/2ML IJ SOLN
INTRAMUSCULAR | Status: DC | PRN
  Administered 2023-08-13: 4 mg via INTRAVENOUS

## 2023-08-13 MED ORDER — ORAL CARE MOUTH RINSE: 15.0000 mL | Freq: Once | OROMUCOSAL | Status: AC

## 2023-08-13 MED ORDER — MIDAZOLAM HCL 2 MG/2ML IJ SOLN
INTRAMUSCULAR | Status: AC
Start: 2023-08-13 — End: 2023-08-13
  Filled 2023-08-13: qty 2

## 2023-08-13 MED ORDER — BUPIVACAINE HCL (PF) 0.25 % IJ SOLN
INTRAMUSCULAR | Status: AC
  Filled 2023-08-13: qty 30

## 2023-08-13 MED ORDER — STERILE WATER FOR IRRIGATION IR SOLN
Status: DC | PRN
Start: 2023-08-13 — End: 2023-08-13
  Administered 2023-08-13: 1000 mL

## 2023-08-13 MED ORDER — ESTRADIOL 0.1 MG/GM VA CREA
TOPICAL_CREAM | VAGINAL | Status: AC
Start: 2023-08-13 — End: 2023-08-13
  Filled 2023-08-13: qty 42.5

## 2023-08-13 MED ORDER — TRAMADOL HCL 50 MG PO TABS
50.0000 mg | ORAL_TABLET | Freq: Four times a day (QID) | ORAL | Status: DC
  Filled 2023-08-13 (×2): qty 1

## 2023-08-13 MED ORDER — PROMETHAZINE HCL 12.5 MG PO TABS: 12.5000 mg | ORAL_TABLET | Freq: Four times a day (QID) | ORAL | 0 refills | Status: AC | PRN

## 2023-08-13 MED ORDER — GABAPENTIN 100 MG PO CAPS
100.0000 mg | ORAL_CAPSULE | Freq: Three times a day (TID) | ORAL | Status: DC
  Administered 2023-08-13 – 2023-08-14 (×2): 100 mg via ORAL
  Filled 2023-08-13 (×2): qty 1

## 2023-08-13 MED ORDER — ROCURONIUM BROMIDE 10 MG/ML (PF) SYRINGE
PREFILLED_SYRINGE | INTRAVENOUS | Status: AC
  Filled 2023-08-13: qty 10

## 2023-08-13 MED ORDER — SUGAMMADEX SODIUM 200 MG/2ML IV SOLN
INTRAVENOUS | Status: AC
Start: 2023-08-13 — End: 2023-08-13
  Filled 2023-08-13: qty 2

## 2023-08-13 SURGICAL SUPPLY — 52 items
BLADE SURG 15 STRL LF DISP TIS (BLADE) ×2 IMPLANT
COVER BACK TABLE 60X90IN (DRAPES) ×2 IMPLANT
COVER MAYO STAND STRL (DRAPES) ×4 IMPLANT
DERMABOND ADVANCED .7 DNX12 (GAUZE/BANDAGES/DRESSINGS) IMPLANT
DRAPE SURG IRRIG POUCH 19X23 (DRAPES) ×2 IMPLANT
DRSG OPSITE POSTOP 3X4 (GAUZE/BANDAGES/DRESSINGS) IMPLANT
DURAPREP 26ML APPLICATOR (WOUND CARE) ×2 IMPLANT
ELECTRODE REM PT RTRN 9FT ADLT (ELECTROSURGICAL) IMPLANT
FILTER SMOKE EVAC LAPAROSHD (FILTER) ×2 IMPLANT
GAUZE STRIP PACKING 2INX5YD (MISCELLANEOUS) ×2 IMPLANT
GLOVE BIO SURGEON STRL SZ 6 (GLOVE) ×6 IMPLANT
GLOVE BIOGEL PI IND STRL 6.5 (GLOVE) ×6 IMPLANT
GLOVE BIOGEL PI IND STRL 7.0 (GLOVE) ×2 IMPLANT
GLOVE SURG UNDER POLY LF SZ7 (GLOVE) ×4 IMPLANT
GOWN STRL REUS W/ TWL LRG LVL3 (GOWN DISPOSABLE) ×8 IMPLANT
IRRIGATION SUCT STRKRFLW 2 WTP (MISCELLANEOUS) IMPLANT
KIT PINK PAD W/HEAD ARM REST (MISCELLANEOUS) ×2 IMPLANT
KIT TURNOVER KIT B (KITS) ×2 IMPLANT
LEGGING LITHOTOMY PAIR STRL (DRAPES) ×2 IMPLANT
NDL HYPO 22X1.5 SAFETY MO (MISCELLANEOUS) IMPLANT
NDL INSUFFLATION 14GA 120MM (NEEDLE) IMPLANT
NDL MAYO CATGUT SZ4 TPR NDL (NEEDLE) IMPLANT
NDL SPNL 22GX3.5 QUINCKE BK (NEEDLE) IMPLANT
NEEDLE HYPO 22X1.5 SAFETY MO (MISCELLANEOUS) IMPLANT
NEEDLE INSUFFLATION 14GA 120MM (NEEDLE) IMPLANT
NEEDLE MAYO CATGUT SZ4 (NEEDLE) IMPLANT
NEEDLE SPNL 22GX3.5 QUINCKE BK (NEEDLE) IMPLANT
NS IRRIG 1000ML POUR BTL (IV SOLUTION) ×2 IMPLANT
PACK LAVH (CUSTOM PROCEDURE TRAY) ×2 IMPLANT
PACK ROBOTIC GOWN (GOWN DISPOSABLE) ×2 IMPLANT
PACK VAGINAL WOMENS (CUSTOM PROCEDURE TRAY) ×2 IMPLANT
SET CYSTO W/LG BORE CLAMP LF (SET/KITS/TRAYS/PACK) ×2 IMPLANT
SET TUBE SMOKE EVAC HIGH FLOW (TUBING) ×2 IMPLANT
SHEARS HARMONIC 36 ACE (MISCELLANEOUS) ×4 IMPLANT
SLEEVE Z-THREAD 5X100MM (TROCAR) ×4 IMPLANT
SPIKE FLUID TRANSFER (MISCELLANEOUS) IMPLANT
SURGILUBE 2OZ TUBE FLIPTOP (MISCELLANEOUS) ×2 IMPLANT
SUT PROLENE 1 CTX 30 8455H (SUTURE) IMPLANT
SUT SILK 0 SH 30 (SUTURE) ×2 IMPLANT
SUT VIC AB 0 CT1 27XBRD ANBCTR (SUTURE) IMPLANT
SUT VIC AB 1 CT1 18XBRD ANBCTR (SUTURE) ×4 IMPLANT
SUT VIC AB 2-0 CT1 (SUTURE) ×2 IMPLANT
SUT VIC AB 2-0 CT2 27 (SUTURE) ×8 IMPLANT
SUT VIC AB 3-0 CT1 TAPERPNT 27 (SUTURE) IMPLANT
SUT VIC AB 3-0 SH 27X BRD (SUTURE) ×4 IMPLANT
SUT VIC AB 4-0 PS2 18 (SUTURE) ×2 IMPLANT
SUT VICRYL 0 TIES 12 18 (SUTURE) ×2 IMPLANT
SUT VICRYL 0 UR6 27IN ABS (SUTURE) IMPLANT
TOWEL GREEN STERILE FF (TOWEL DISPOSABLE) ×4 IMPLANT
TRAY FOLEY W/BAG SLVR 14FR (SET/KITS/TRAYS/PACK) ×2 IMPLANT
TROCAR XCEL NON-BLD 5MMX100MML (ENDOMECHANICALS) ×2 IMPLANT
WARMER LAPAROSCOPE (MISCELLANEOUS) ×2 IMPLANT

## 2023-08-13 NOTE — Interval H&P Note (Signed)
 History and Physical Interval Note:  08/13/2023 7:25 AM  Janet Myers  has presented today for surgery, with the diagnosis of abnormal uterine bleeding.  The various methods of treatment have been discussed with the patient and family. After consideration of risks, benefits and other options for treatment, the patient has consented to  Procedure(s): HYSTERECTOMY, VAGINAL, LAPAROSCOPY-ASSISTED, WITH SALPINGECTOMY (Bilateral) CYSTOSCOPY (N/A) COLPORRHAPHY, ANTERIOR, FOR CYSTOCELE REPAIR (N/A) as a surgical intervention.  The patient's history has been reviewed, patient examined, no change in status, stable for surgery.  I have reviewed the patient's chart and labs.  Questions were answered to the patient's satisfaction.     Anahit Klumb M Alanzo Lamb

## 2023-08-13 NOTE — Brief Op Note (Signed)
 08/13/2023  10:02 AM  PATIENT:  Janet Myers  48 y.o. female  PRE-OPERATIVE DIAGNOSIS:  abnormal uterine bleeding  POST-OPERATIVE DIAGNOSIS:  abnormal uterine bleeding  PROCEDURE:  Procedure(s): HYSTERECTOMY, VAGINAL, LAPAROSCOPY-ASSISTED, WITH SALPINGECTOMY AND RIGHT OVARIAN CYSTECTOMY (Bilateral) CYSTOSCOPY (N/A)  SURGEON:  Surgeons and Role:    * Valon Glasscock, Lavonia HERO, MD - Primary  ASSISTANTS: Krystal Deaner, MD  ANESTHESIA:   general  EBL:  200 mL  IVF 1400 UOP 150cc  BLOOD ADMINISTERED:none  DRAINS: none   LOCAL MEDICATIONS USED:  MARCAINE      SPECIMEN:  Bilateral fallopian tubes, uterus, cervix, right ovarian cyst  DISPOSITION OF SPECIMEN:  PATHOLOGY  COUNTS:  YES  TOURNIQUET:  * No tourniquets in log *  DICTATION: .Note written in EPIC  PLAN OF CARE: Admit for overnight observation  PATIENT DISPOSITION:  PACU - hemodynamically stable.   Delay start of Pharmacological VTE agent (>24hrs) due to surgical blood loss or risk of bleeding: yes

## 2023-08-13 NOTE — Transfer of Care (Signed)
 Immediate Anesthesia Transfer of Care Note  Patient: Janet Myers  Procedure(s) Performed: HYSTERECTOMY, VAGINAL, LAPAROSCOPY-ASSISTED, WITH SALPINGECTOMY AND RIGHT OVARIAN CYSTECTOMY (Bilateral) CYSTOSCOPY  Patient Location: PACU  Anesthesia Type:General  Level of Consciousness: oriented and patient cooperative  Airway & Oxygen Therapy: Patient Spontanous Breathing  Post-op Assessment: Report given to RN, Post -op Vital signs reviewed and stable, Patient moving all extremities X 4, and Patient able to stick tongue midline  Post vital signs: Reviewed and stable  Last Vitals:  Vitals Value Taken Time  BP 108/50 08/13/23 10:09  Temp 97.7   Pulse 71 08/13/23 10:11  Resp 11 08/13/23 10:11  SpO2 98 % 08/13/23 10:11  Vitals shown include unfiled device data.  Last Pain:  Vitals:   08/13/23 0605  TempSrc: Oral  PainSc: 1       Patients Stated Pain Goal: 7 (08/13/23 9394)  Complications: No notable events documented.

## 2023-08-13 NOTE — Progress Notes (Signed)
 Given patient nausea with oxy and percocet, cancel orders and instead gabapentin  100mg  TID ordered with tramadol  50mg  q6hr PRN. PRN nausea meds still available. BP (!) 113/47 (BP Location: Left Arm)   Pulse 70   Temp 98.2 F (36.8 C) (Oral)   Resp 18   Ht 5' 1 (1.549 m)   Wt 53.5 kg   LMP 08/13/2023 (Exact Date)   SpO2 100%   BMI 22.30 kg/m

## 2023-08-13 NOTE — Anesthesia Postprocedure Evaluation (Signed)
 Anesthesia Post Note  Patient: Janet Myers  Procedure(s) Performed: HYSTERECTOMY, VAGINAL, LAPAROSCOPY-ASSISTED, WITH SALPINGECTOMY AND RIGHT OVARIAN CYSTECTOMY (Bilateral) CYSTOSCOPY     Patient location during evaluation: PACU Anesthesia Type: General Level of consciousness: awake and alert Pain management: pain level controlled Vital Signs Assessment: post-procedure vital signs reviewed and stable Respiratory status: spontaneous breathing, nonlabored ventilation, respiratory function stable and patient connected to nasal cannula oxygen Cardiovascular status: blood pressure returned to baseline and stable Postop Assessment: no apparent nausea or vomiting Anesthetic complications: no   No notable events documented.  Last Vitals:  Vitals:   08/13/23 1028 08/13/23 1030  BP: (!) 114/52 (!) 114/52  Pulse: 73 60  Resp: 16 10  Temp:    SpO2: 98% 96%    Last Pain:  Vitals:   08/13/23 1028  TempSrc:   PainSc: 7                  Mckay Brandt

## 2023-08-13 NOTE — Progress Notes (Signed)
 POD #0  Subjective:  No acute events since surgery. Main complaint is nausea which patient expected after anesthesia (h/o N/V with opiods).  Pt denies problems with ambulating or voiding or po intake (liquid sips).Pain is well controlled.  She has not had flatus. She has not had bowel movement. No VB. Reviewed intra-op findings  Objective: Blood pressure (!) 107/57, pulse 66, temperature (!) 97.5 F (36.4 C), temperature source Oral, resp. rate 16, height 5' 1 (1.549 m), weight 53.5 kg, last menstrual period 08/13/2023, SpO2 98%.  Physical Exam:  General: alert, cooperative and no distress Lochia:normal flow Chest: CTAB Heart: RRR no m/r/g Abdomen: +BS, soft, nontender. Lap sites x3 CDI Extremities: neg edema, neg calf TTP BL, neg Homans BL  No results for input(s): HGB, HCT in the last 72 hours.  Assessment/Plan:  ASSESSMENT: Janet Myers is a 48 y.o. [phone] s/p LAVH/BS/cysto with rt ovarian cystectomy for AUB/pelvic pain. PMHx s/f migraines, IBS.   -Continue PO pain meds -Encourage ambulation and incentive spirometry -Anticipate DC home POD1   LOS: 0 days

## 2023-08-13 NOTE — Anesthesia Procedure Notes (Signed)
 Procedure Name: Intubation Date/Time: 08/13/2023 7:42 AM  Performed by: Viviana Almarie DASEN, CRNAPre-anesthesia Checklist: Patient identified, Emergency Drugs available, Suction available and Patient being monitored Patient Re-evaluated:Patient Re-evaluated prior to induction Oxygen Delivery Method: Circle system utilized Preoxygenation: Pre-oxygenation with 100% oxygen Induction Type: IV induction Ventilation: Mask ventilation without difficulty Grade View: Grade I Tube type: Oral Tube size: 6.5 mm Number of attempts: 1 Airway Equipment and Method: Stylet and Bite block Placement Confirmation: ETT inserted through vocal cords under direct vision, positive ETCO2 and breath sounds checked- equal and bilateral Secured at: 21 cm Tube secured with: Tape Dental Injury: Teeth and Oropharynx as per pre-operative assessment

## 2023-08-13 NOTE — Op Note (Addendum)
 08/13/23 Surgeon: Lavonia Guppy, MD Assist: Krystal Deaner, MD Preoperative diagnosis: Abnormal uterine bleeding, chronic pelvic pain Postoperative diagnosis: same as above Procedure Laparoscopic-assisted vaginal hysterectomy, bilateral salpingectomy, cystoscopy; rt ovarian cystectomy Anesthesia: GETA EBL: 200cc UOP:150CC IVF: 1400cc Specimen: uterus, cervix, bilateral fallopian tubes to pathology Findings: Cervical prolapse vs hypertrophy at introitus. 9-10cm uterus with irregular contour. On insertion of scope, 3cm fibroid IM in left lat anterior body. Bilateral fallopian tubes with evidence of prior ligation. BL ovaries WNL save for 1cm cyst on inferior portion of rt ov, smooth.. [place] WNL. Scant adhesion from prior cesarean, uterus retroverted. After completion of hysterectomy, no evidence of vaginal prolapse.   Consent: Risks of hysterectomy include infection of the uterus, pelvic organs, or skin, inadvertent injury to internal organs, such as bowel or bladder. If there is major injury, extensive surgery may be required. If injury is minor, it may be treated with relative ease. Discussed possibility of excessive blood loss and transfusion. Patient aware that no future fertility will remain after procedure. Patient accepts the possibility of blood transfusion, if necessary. Bowel and/or bladder injury may require prolonged inpatient stay and possible colostomy, Foley catheter, etc, as deemed fit by other surgeon. Patient understands and agrees to move forward with surgery.   Operative procedure: Patient was taken to the operating room where general anesthesia was obtained without difficulty. She was then prepped and draped in the normal sterile fashion in the dorsal lithotomy position. An appropriate timeout was performed. A speculum was then placed within the vagina and a Hulka tenaculum placed within the cervix for uterine manipulation. A foley catheter was placed in the bladder.  Attention  was then turned to the patient's abdomen after draping where the infraumbilical area was injected with approximately 10 cc of quarter percent Marcaine . A 1 cm incision was then made within the umbilicus and the varies needle easily introduced into the peritoneal cavity. Intraperitoneal placement was confirmed by aspiration and injection with normal saline. Gas flow was then applied and a pneumoperitoneum obtained with approximate 3 L of CO2 gas. The varies needle was then removed and a 5 mm optiview trocar was easily introduced into the abdomen under direct visualization. . With patient in Trendelenburg the uterus and tubes and ovaries were inspected with findings as previously stated. Two additional trocars were placed in the upper lateral quadrants under direct visualization after injection with quarter percent marcaine .  THe Harmonic scalpel was then utilized to dissect the fallopian tubes from the mesosalpinx bilaterally down to the level of the cornua.  The remainder of the uteroovarian ligament and the round ligament were then also taken down with the Harmonic to the level of the bladder flap.  The bladder flap was taken down from the lower uterine segment and pushed away to expose the cervix.  An area of bleeding at the left uterine artery was cauterized.  Attention was then turned to the vagina after all instruments were removed and the trocars covered with a sterile drape. The cervix was grasped with Veatrice tenaculums x 2 and injected with a dilute solution of Pitressin circumferentially.  The bovie was then used to make a circumferential incision.  The mayo scissors then further dissected the vaginal mucosa from the underlying cervix and the anterior and posterior cul de sac entered sharply.  With a banana speculum and deaver retractor isolating the uterus from the bladder and rectum.  The uterosacral ligaments and paracervical tissue was taken down sequentially with parametrial clamps and suture ligated  with  zero vicryl at each step.  When  the was uterus freed on the patient's left, it was then delivered and the remaining tissue on the right clamped and transected completely freeing the uterus and tubes.  It was handed off to pathology.   A small amount of bleeding on the right was controlled with several figure of eight sutures of zero vicryl and all was hemostatic. The uterosacral ligaments were approximated with silk stitch then USL tags brought together for additional apical support.  The short weighted speculum was placed. Posterior cuff run with 0-vicryl for hemostasis between vaignal mucosa and peritoneum.Finally,the cuff was run with a running locked 2-0 vicryl for hemostasis. All instruments were then removed from the vagina.    Cystoscopy was then carried out. IV fluorescin was given by anesthesia. 30 degree scope inserted into urethral meatus where survey was performed. Positive bubble sign, no evidence of suture or defect. Bilateral green jets indicating ureteral efflux were noted. Scope withdrawn.   Gowns and gloves were changed and attention was returned to the abdomen, where pneumoperitoneum was again obtained and all inspected.  The cuff and pedicles were hemostatic and the ureters visualized and normal in appearance. Harmonic scalpel used to carry out rt ov cystectomy and specimen was passed off to pathology. A four quadrant view of the pelvis and abdomen was performed and found to be normal with no bleeding or injuries noted.  The instruments were removed from the abdomen as well as the 5 mm lateral ports under visualization.  The pneumoperitoneum was reduced through the trocar. The trocar was finally removed and the infraumbilical incision and lateral incisions were closed with a subcuticular stitch of 3-0 Vicryl. Dermabond and a bandage were placed. Patient was then awakened and taken to the recovery room in good condition.

## 2023-08-14 ENCOUNTER — Encounter (HOSPITAL_COMMUNITY): Payer: Self-pay | Admitting: Obstetrics and Gynecology

## 2023-08-14 DIAGNOSIS — D259 Leiomyoma of uterus, unspecified: Secondary | ICD-10-CM | POA: Diagnosis not present

## 2023-08-14 LAB — CBC
HCT: 30.8 % — ABNORMAL LOW (ref 36.0–46.0)
Hemoglobin: 10.3 g/dL — ABNORMAL LOW (ref 12.0–15.0)
MCH: 26.9 pg (ref 26.0–34.0)
MCHC: 33.4 g/dL (ref 30.0–36.0)
MCV: 80.4 fL (ref 80.0–100.0)
Platelets: 286 K/uL (ref 150–400)
RBC: 3.83 MIL/uL — ABNORMAL LOW (ref 3.87–5.11)
RDW: 15.3 % (ref 11.5–15.5)
WBC: 7.1 K/uL (ref 4.0–10.5)
nRBC: 0 % (ref 0.0–0.2)

## 2023-08-14 MED ORDER — TRAMADOL HCL 50 MG PO TABS: 50.0000 mg | ORAL_TABLET | Freq: Four times a day (QID) | ORAL | 0 refills | Status: AC | PRN

## 2023-08-14 MED ORDER — GABAPENTIN 100 MG PO CAPS: 100.0000 mg | ORAL_CAPSULE | Freq: Three times a day (TID) | ORAL | 0 refills | Status: AC

## 2023-08-14 NOTE — Plan of Care (Signed)
  Problem: Education: Goal: Knowledge of General Education information will improve Description: Including pain rating scale, medication(s)/side effects and non-pharmacologic comfort measures Outcome: Progressing   Problem: Health Behavior/Discharge Planning: Goal: Ability to manage health-related needs will improve Outcome: Progressing   Problem: Clinical Measurements: Goal: Ability to maintain clinical measurements within normal limits will improve Outcome: Progressing   Problem: Activity: Goal: Risk for activity intolerance will decrease Outcome: Progressing   Problem: Nutrition: Goal: Adequate nutrition will be maintained Outcome: Progressing   Problem: Pain Managment: Goal: General experience of comfort will improve and/or be controlled Outcome: Progressing

## 2023-08-14 NOTE — Progress Notes (Signed)
 POD #1  Subjective:  No acute events overnight. Pain med controlled changed to D/C percocet and include tramadol  and gabapentin . Improved N/V this AM. Pt denies problems with ambulating or voiding or po intake  She has had flatus. She has not had bowel movement. No VB. Reviewed intra-op findings  Objective: Blood pressure (!) 104/56, pulse 66, temperature 98.3 F (36.8 C), temperature source Oral, resp. rate 16, height 5' 1 (1.549 m), weight 53.5 kg, last menstrual period 08/13/2023, SpO2 99%.  Physical Exam:  General: alert, cooperative and no distress Lochia:normal flow Chest: CTAB Heart: RRR no m/r/g Abdomen: +BS, soft, nontender. Lap sites x3 CDI Extremities: neg edema, neg calf TTP BL, neg Homans BL  Recent Labs    08/14/23 0551  HGB 10.3*  HCT 30.8*    Assessment/Plan:  ASSESSMENT: Janet Myers is a 48 y.o. [phone] s/p LAVH/BS/cysto with rt ovarian cystectomy for AUB/pelvic pain. PMHx s/f migraines, IBS.   -Continue PO pain meds -Encourage ambulation and incentive spirometry -Anticipate DC home today. Incision check in 2wks with formal postop in 6wks.   LOS: 0 days

## 2023-08-14 NOTE — Discharge Summary (Signed)
 Physician Discharge Summary  Patient ID: Janet Myers MRN: [id number] DOB/AGE: 03-04-1975 48 y.o.  Admit date: 08/13/2023 Discharge date: 08/14/2023  Admission Diagnoses:  Discharge Diagnoses:  Principal Problem:   Abnormal uterine bleeding (AUB)   Discharged Condition: good  Hospital Course: Admitted for scheduled LAVH?BS/cysto with rt ov cystectomy for AUB and pelvic pain. Uncomplicated procedure, please see operative note for full details. By POD#1, ambulating, tolerating PO, voiding, pain controlled on PO meds. Discharged home in stable fashion with routine precautions  Discharge Exam: Blood pressure (!) 104/56, pulse 66, temperature 98.3 F (36.8 C), temperature source Oral, resp. rate 16, height 5' 1 (1.549 m), weight 53.5 kg, last menstrual period 08/13/2023, SpO2 99%. General: alert, cooperative and no distress Lochia:normal flow Chest: CTAB Heart: RRR no m/r/g Abdomen: +BS, soft, nontender. Lap sites x3 CDI Extremities: neg edema, neg calf TTP BL, neg Homans BL  Disposition: Discharge disposition: 01-Home or Self Care       Discharge Instructions     Call MD for:  difficulty breathing, headache or visual disturbances   Complete by: As directed    Call MD for:  hives   Complete by: As directed    Call MD for:  persistant dizziness or light-headedness   Complete by: As directed    Call MD for:  persistant nausea and vomiting   Complete by: As directed    Call MD for:  redness, tenderness, or signs of infection (pain, swelling, redness, odor or green/yellow discharge around incision site)   Complete by: As directed    Call MD for:  severe uncontrolled pain   Complete by: As directed    Call MD for:  temperature >100.4   Complete by: As directed    Diet - low sodium heart healthy   Complete by: As directed    Increase activity slowly   Complete by: As directed    Lifting restrictions   Complete by: As directed    Nothing more than 15lbs for 6wks    No wound care   Complete by: As directed    Other Restrictions   Complete by: As directed    No soaking in pool or tub for 6wks   Sexual Activity Restrictions   Complete by: As directed    None for 6 weeks      Allergies as of 08/14/2023       Reactions   Codeine Nausea And Vomiting   Hydrocodone Nausea And Vomiting   Oxycodone  Nausea And Vomiting        Medication List     TAKE these medications    ABREVA EX Apply topically as needed (fever blister).   Clear Eyes Contact Lens Relief Soln by Does not apply route as needed.   gabapentin  100 MG capsule Commonly known as: NEURONTIN  Take 1 capsule (100 mg total) by mouth 3 (three) times daily for 15 days.   ibuprofen  800 MG tablet Commonly known as: ADVIL  Take 1 tablet (800 mg total) by mouth 3 (three) times daily. What changed:  medication strength how much to take when to take this reasons to take this   ondansetron  4 MG tablet Commonly known as: ZOFRAN  Take 1 tablet (4 mg total) by mouth every 6 (six) hours as needed for nausea.   oxyCODONE -acetaminophen  5-325 MG tablet Commonly known as: PERCOCET/ROXICET Take 1 tablet by mouth every 6 (six) hours as needed for severe pain (pain score 7-10).   promethazine  12.5 MG tablet Commonly known as: PHENERGAN  Take 1  tablet (12.5 mg total) by mouth every 6 (six) hours as needed for nausea or vomiting.   simethicone  80 MG chewable tablet Commonly known as: MYLICON Chew 1 tablet (80 mg total) by mouth 4 (four) times daily as needed for flatulence.   traMADol  50 MG tablet Commonly known as: ULTRAM  Take 1 tablet (50 mg total) by mouth every 6 (six) hours as needed for severe pain (pain score 7-10).        Follow-up Information     Kasten Leveque, Lavonia HERO, MD. Go in 2 week(s).   Specialty: Obstetrics and Gynecology Contact information: [address] [place] [address] [place] [address] [phone]                 Signed: Lavonia HERO [facility] 08/14/2023,  8:14 AM

## 2023-08-19 LAB — SURGICAL PATHOLOGY
